# Patient Record
Sex: Female | Born: 1985 | Race: White | Hispanic: No | Marital: Married | Smoking: Never smoker
Health system: Southern US, Academic
[De-identification: ages and names within clinical notes are randomized; demographics above are authoritative.]

## PROBLEM LIST (undated history)

## (undated) DIAGNOSIS — I10 Essential (primary) hypertension: Secondary | ICD-10-CM

## (undated) HISTORY — PX: HX GALL BLADDER SURGERY/CHOLE: SHX55

---

## 2006-02-05 ENCOUNTER — Emergency Department: Payer: Self-pay | Admitting: Emergency Medicine

## 2009-09-24 ENCOUNTER — Emergency Department: Payer: Self-pay | Admitting: Emergency Medicine

## 2009-11-05 ENCOUNTER — Emergency Department: Payer: Self-pay | Admitting: Unknown Physician Specialty

## 2009-11-17 ENCOUNTER — Ambulatory Visit: Payer: Self-pay

## 2010-04-21 ENCOUNTER — Emergency Department: Payer: Self-pay | Admitting: Emergency Medicine

## 2013-02-04 ENCOUNTER — Emergency Department: Payer: Self-pay | Admitting: Emergency Medicine

## 2013-05-03 ENCOUNTER — Emergency Department: Payer: Self-pay | Admitting: Emergency Medicine

## 2014-08-29 ENCOUNTER — Emergency Department: Payer: Self-pay | Admitting: Emergency Medicine

## 2014-08-29 LAB — BASIC METABOLIC PANEL
Anion Gap: 8 (ref 7–16)
BUN: 5 mg/dL — AB (ref 7–18)
CO2: 29 mmol/L (ref 21–32)
CREATININE: 0.59 mg/dL — AB (ref 0.60–1.30)
Calcium, Total: 8 mg/dL — ABNORMAL LOW (ref 8.5–10.1)
Chloride: 106 mmol/L (ref 98–107)
EGFR (African American): >60
EGFR (Non-African Amer.): >60
GLUCOSE: 75 mg/dL (ref 65–99)
OSMOLALITY: 281 (ref 275–301)
Potassium: 3.5 mmol/L (ref 3.5–5.1)
Sodium: 143 mmol/L (ref 136–145)

## 2014-08-29 LAB — CBC
HCT: 38.9 % (ref 35.0–47.0)
HGB: 12.7 g/dL (ref 12.0–16.0)
MCH: 32.1 pg (ref 26.0–34.0)
MCHC: 32.7 g/dL (ref 32.0–36.0)
MCV: 98 fL (ref 80–100)
Platelet: 216 10*3/uL (ref 150–440)
RBC: 3.96 10*6/uL (ref 3.80–5.20)
RDW: 12.2 % (ref 11.5–14.5)
WBC: 5.3 10*3/uL (ref 3.6–11.0)

## 2014-08-29 LAB — TROPONIN I: Troponin-I: <0.02 ng/mL

## 2014-11-01 ENCOUNTER — Emergency Department: Payer: Self-pay | Admitting: Internal Medicine

## 2015-02-27 ENCOUNTER — Emergency Department
Admit: 2015-02-27 | Disposition: A | Discharge status: Home / Self Care | Payer: Self-pay | Admitting: Emergency Medicine

## 2015-02-27 LAB — URINALYSIS, COMPLETE
Bilirubin,UR: NEGATIVE
Blood: NEGATIVE
Glucose,UR: NEGATIVE mg/dL
Ketone: NEGATIVE
Leukocyte Esterase: NEGATIVE
Nitrite: NEGATIVE
Ph: 6
Protein: NEGATIVE
Specific Gravity: 1.002

## 2015-05-29 ENCOUNTER — Emergency Department
Admission: EM | Admit: 2015-05-29 | Discharge: 2015-05-29 | Disposition: A | Discharge status: Home / Self Care | Attending: Emergency Medicine | Admitting: Emergency Medicine

## 2015-05-29 ENCOUNTER — Encounter (HOSPITAL_COMMUNITY): Payer: Self-pay

## 2015-05-29 ENCOUNTER — Emergency Department (EMERGENCY_DEPARTMENT_HOSPITAL)

## 2015-05-29 DIAGNOSIS — W010XXA Fall on same level from slipping, tripping and stumbling without subsequent striking against object, initial encounter: Secondary | ICD-10-CM | POA: Insufficient documentation

## 2015-05-29 DIAGNOSIS — R609 Edema, unspecified: Secondary | ICD-10-CM

## 2015-05-29 DIAGNOSIS — S93401A Sprain of unspecified ligament of right ankle, initial encounter: Secondary | ICD-10-CM

## 2015-05-29 MED ORDER — IBUPROFEN 600 MG TABLET
600.00 mg | ORAL_TABLET | Freq: Three times a day (TID) | ORAL | Status: AC | PRN
Start: 2015-05-29 — End: ?

## 2015-05-29 MED ORDER — NAPROXEN 500 MG TABLET
500.00 mg | ORAL_TABLET | ORAL | Status: AC
Start: 2015-05-29 — End: 2015-05-29
  Administered 2015-05-29: 500 mg via ORAL
  Filled 2015-05-29: qty 1

## 2015-05-29 NOTE — ED Nurses Note (Signed)
Pt provided crutches and given instruction for proper use. Verbalized  Understanding. Resident at bedside. Awaiting final results of xrays.

## 2015-05-29 NOTE — ED Provider Notes (Signed)
[facility] Emergency Department    CC:  Ankle Injury    HPI:  Jeanette Trevino is a 29 y.o. female who presents with right ankle injury.  Patient states she tripped and had a mechanical fall early this morning during which she rolled her right ankle and fell to her knees.  She was initially ambulatory at the time.  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  She complaints of pain throughout the ankle.  She denies other injuries at this time.      ROS:  Constitutional: No fever, chills, or weakness  HENT: No headaches  Eyes: No vision changes   Cardio: No chest pain  Respiratory: No cough, SOB  GI:  No nausea, vomiting, diarrhea, constipation  MSK: + Right ankle pain  Neuro: No loss of sensation, confusion, focal deficits, numbness, tingling  Psychiatric: No mood changes  All other systems negative.    Medications:  Medications Prior to Admission     None          Allergies:  No Known Allergies  Allergies reviewed with patient    History reviewed. No pertinent past medical history.      History reviewed with patient    Past Surgical History   Procedure Laterality Date    Hx gall bladder surgery/chole             History     Social History    Marital Status: N/A     Spouse Name: N/A    Number of Children: N/A    Years of Education: N/A     Occupational History    Not on file.     Social History Main Topics    Smoking status: Never Smoker     Smokeless tobacco: Not on file    Alcohol Use: No    Drug Use: No    Sexual Activity: Not on file     Other Topics Concern    Not on file     Social History Narrative    No narrative on file       No family history on file.        Physical Exam:  All nurse's notes reviewed.  ED Triage Vitals   Enc Vitals Group      BP (Non-Invasive) 05/29/15 0642 146/88 mmHg      Heart Rate 05/29/15 0642 118      Respiratory Rate 05/29/15 0642 20      Temperature 05/29/15  0642 36.8 C (98.2 F)      Temp src --       SpO2-1 05/29/15 0642 97 %      Weight 05/29/15 0642 136.079 kg (300 lb)      Height 05/29/15 0642 1.702 m (5' 7.01")      Head Cir --       Peak Flow --       Pain Score --       Pain Loc --       Pain Edu? --       Excl. in GC? --      Constitutional: NAD. Oriented  HENT:   Head: Normocephalic and atraumatic.   Mouth/Throat: Oropharynx is clear and moist.   Eyes: EOMI, conjunctivae without discharge bilaterally  Neck: Trachea midline.   Cardiovascular: RRR, No murmurs, rubs or gallops.   Pulmonary/Chest: BS equal  bilaterally, good air movement. No respiratory distress. No wheezes, rales.   Abdominal: BS +. Abdomen soft, no tenderness, rebound or guarding.               Musculoskeletal: Right ankle TTP over lateral malleolus and fifth metatarsal.  Slight swelling to the area, no ecchymosis.  Decreased ROM.  To tenderness along proximal fibula. 2+ pedal pulse   Skin: warm and dry. No rash, erythema, pallor or cyanosis  Psychiatric: normal mood and affect. Behavior is normal.   Neurological: Alert&Ox3. Grossly intact.     Labs:  No results found for this or any previous visit (from the past 24 hour(s)).    Imaging:  Results for orders placed or performed during the hospital encounter of 05/29/15   XR ANKLE RIGHT     Status: None    Narrative    Jeanette Trevino  XR ANKLE RIGHT performed on May 29, 2015  7:05 AM.    CLINICAL HISTORY: 29 y.o. female with fall, pain.    3 views of the right ankle were obtained with no comparisons available.    FINDINGS:  There is no evidence of acute fracture or dislocation visualized osseous   structures.  There is soft tissue edema about the ankle, particularly over   the lateral aspect.  Alignment of the ankle joint is maintained the ankle   mortise preserved.      Impression    Soft tissue edema with no evidence of acute fracture or dislocation.     XR FOOT RIGHT     Status: None    Narrative    Jeanette Trevino  XR FOOT RIGHT performed on  May 29, 2015  7:05 AM.    CLINICAL HISTORY: 29 y.o. female with fall, pain.    3 views of the right foot were obtained with no comparisons available.    FINDINGS:  There is no evidence of acute fracture or dislocation in the visualized   osseous structures.  Soft tissue edema is seen about the ankle and the mid   foot.  Normal alignment of the foot is maintained.      Impression    Soft tissue edema of the ankle and foot with no fracture or dislocation   seen.     XR TIBIA-FIBULA RIGHT     Status: None    Narrative    Jeanette Trevino  XR TIBIA-FIBULA RIGHT performed on May 29, 2015  7:05 AM.    CLINICAL HISTORY: 29 y.o. female with fall, pain.    3 views of the right tibia and fibula were obtained with no comparisons   available.    FINDINGS:  There is no evidence of acute fracture or dislocation in the visualized   osseous structures.  Edema is seen about the ankle, otherwise no soft   tissue abnormalities are seen.  Alignment of the knee and ankle joints is   preserved.      Impression    Soft tissue edema about the ankle with no evidence of fracture or   dislocation.       The following orders have been placed.    Orders Placed This Encounter    XR ANKLE RIGHT    XR FOOT RIGHT    XR TIBIA-FIBULA RIGHT       Neita Goodnight, MD 05/29/2015, 06:48    Abnormal Lab results:  Labs Reviewed - No data to display    Plan: Appropriate labs and imaging ordered. Medical Records reviewed.  Therapy/Procedures/Course/MDM: Patient was vitally stable throughout visit.  She was given naproxen for pain.  Xrays of the right foot, ankle, and tib/fib were negative for acute bony injury.  Patient was updated with these results.  She was given an aircast for support of the ankle and crutches and told to follow up with her PCP.  They were given an opportunity to ask questions.  Consults: None  Impression: Jeanette Trevino is a 29 year old female presenting with right ankle injury.  Disposition:    Following the above history, physical  exam, and studies, the patient was deemed stable and suitable for discharge and she will follow up with her PCP. Prescriptions were written for Motrin.  Medication instructions were discussed with the patient/patient's family. Patient was advised to return to the ED with any new, concerning or worsening symptoms and follow up as directed. The patient verbalized understanding of all instructions and had no further questions or concerns.       Neita GoodnightKristen Rosario Kushner, MD 06/07/2015, 13:06  PGY II  [facility]

## 2015-05-29 NOTE — ED Attending Note (Signed)
Critical Care time billed for this visit is 0 min (exclusive of procedure time)    Note begun by: Staci RighterAndrew Zaylynn Rickett, MD on 05/29/2015 at 7:30 AM    History Limitation:  none    Attestation:  I was physically present and directly supervised this patient's care.  Patient seen and examined.  Resident / Trixie DredgeMidlevel / NP history and exam reviewed.   Key elements in addition to and/or correction of that documentation are as follows:    HPI :    29 y.o. y.o. female presents with chief complaint of right ankle injury.  Inversion mechanism.  Didn't fall.  Didn't take any meds.      PMHx:  none    PE :   VS on presentation: Blood pressure 146/88, pulse 118, temperature 36.8 C (98.2 F), resp. rate 20, height 1.702 m (5' 7.01"), weight 136.079 kg (300 lb), last menstrual period 05/22/2015, SpO2 97 %.  Per midlevel provider or resident note    Data/Test :    Pulse-Ox:  Reviewed; Oxygen therapy needed? no  EKG : none  Images Personally Reviewed : None  Image Reports Reviewed:    XR ANKLE RIGHT    (Results Pending)   XR FOOT RIGHT    (Results Pending)   XR TIBIA-FIBULA RIGHT    (Results Pending)     Labs:  Laboratory values obtained as documented in the patients chart and the resident note.  Notable lab values:   Labs Reviewed - No data to display    Review of Prior Data :       Prior Images : None  Prior EKG : None  Online Medical Records:  None  Transfer Docs/Images:  None    Initial Assessment:  Ankle Sprain    MDM/Plan:    XR, naproxen, air cast, crutches    ED Course:   none      PROCEDURES: none    Disposition: Discharge    Clinical Impression:     Encounter Diagnosis   Name Primary?    Right ankle sprain, initial encounter Yes       CRITICAL CARE : none (exclusive of procedure time)    This note was completed after the conclusion of care given the need for direct patient care at the time of service.

## 2015-05-29 NOTE — ED Nurses Note (Signed)
Patient to x-ray via wheel chair

## 2015-05-29 NOTE — Discharge Instructions (Signed)
Please use ice, heat, over the counter pain medications such as tylenol or ibuprofen if instructed not to avoid these, for your symptoms.    Please take all medications you were given prescriptions for as prescribed for your symptoms or diagnoses.    Please follow up with your primary care physician in 3-5 days or earlier if needed for your symptoms.      Please return to the Emergency department if you have persistence or worsening of your symptoms.      General Instructions:   You are considered stable for discharge from the emergency department.  Please carefully follow all the instructions you were given verbally as well as the written instructions given below.  In general, immediately return to the emergency department if the symptoms you presented with today increase in severity, change in any way, and/or do not improve in what you consider an acceptable time frame.  Return if you develop fever >101, vomiting, oral liquid intolerance, chest pain, shortness of breath, weakness, change in behavior, or any other concerns.    Medication(s) Instructions (if applicable):   If you were given any medication(s) upon discharge, please strictly follow the directions as prescribed for taking the medication(s).  Should you feel you develop any type of reaction to the medication(s), including, but not limited to, rash, swelling of the lips or face, or difficulty breathing, immediately discontinue the use of the medication(s) and seek prompt medical care.  Please read the medication(s) insert provided by the pharmacy and follow all guidelines and recommendations.       Follow-Up Instructions:   If you were given instructions to follow-up with a health care provider upon discharge, please be sure to do so.  If an appointment has been requested for you by the Emergency Department, someone from the doctor's office will call you to schedule an appointment.  If you do not hear from that office within an appropriate period of  time, based on when you should have a follow-up appointment, please contact those offices.  Again, if your primary care physician is not part of the [facility], you must contact that office to schedule the appointment.  Please take a copy of your discharge papers with you to your follow-up appointment(s).        Special Information / Instructions:     Please read and follow all attached discharge instructions.      Please call the [facility] at [phone] with any questions or concerns.    Thank you for the opportunity to be a part of your healthcare team.

## 2015-05-29 NOTE — ED Nurses Note (Signed)
Pt return from xray. Pt remains sitting in WC due to pain to stand and pivot. Pt given call bell. Awaiting xray results.

## 2015-06-01 ENCOUNTER — Encounter: Payer: Self-pay | Admitting: Emergency Medicine

## 2015-06-01 ENCOUNTER — Emergency Department
Admission: EM | Admit: 2015-06-01 | Discharge: 2015-06-01 | Disposition: A | Discharge status: Home / Self Care | Payer: BLUE CROSS/BLUE SHIELD | Attending: Emergency Medicine | Admitting: Emergency Medicine

## 2015-06-01 DIAGNOSIS — Y9289 Other specified places as the place of occurrence of the external cause: Secondary | ICD-10-CM | POA: Insufficient documentation

## 2015-06-01 DIAGNOSIS — T148XXA Other injury of unspecified body region, initial encounter: Secondary | ICD-10-CM

## 2015-06-01 DIAGNOSIS — Y9389 Activity, other specified: Secondary | ICD-10-CM | POA: Insufficient documentation

## 2015-06-01 DIAGNOSIS — S3992XA Unspecified injury of lower back, initial encounter: Secondary | ICD-10-CM | POA: Diagnosis present

## 2015-06-01 DIAGNOSIS — X58XXXA Exposure to other specified factors, initial encounter: Secondary | ICD-10-CM | POA: Insufficient documentation

## 2015-06-01 DIAGNOSIS — Z720 Tobacco use: Secondary | ICD-10-CM | POA: Diagnosis not present

## 2015-06-01 DIAGNOSIS — S39012A Strain of muscle, fascia and tendon of lower back, initial encounter: Secondary | ICD-10-CM | POA: Diagnosis not present

## 2015-06-01 DIAGNOSIS — Y998 Other external cause status: Secondary | ICD-10-CM | POA: Diagnosis not present

## 2015-06-01 HISTORY — DX: Essential (primary) hypertension: I10

## 2015-06-01 MED ORDER — ORPHENADRINE CITRATE 30 MG/ML IJ SOLN
60.0000 mg | Freq: Two times a day (BID) | INTRAMUSCULAR | Status: DC
  Administered 2015-06-01: 60 mg via INTRAMUSCULAR
  Filled 2015-06-01: qty 2

## 2015-06-01 MED ORDER — IBUPROFEN 600 MG PO TABS: 600.0000 mg | ORAL_TABLET | Freq: Three times a day (TID) | ORAL | Status: DC | PRN

## 2015-06-01 MED ORDER — KETOROLAC TROMETHAMINE 30 MG/ML IJ SOLN
30.0000 mg | Freq: Once | INTRAMUSCULAR | Status: AC
  Administered 2015-06-01: 30 mg via INTRAVENOUS
  Filled 2015-06-01: qty 1

## 2015-06-01 MED ORDER — CYCLOBENZAPRINE HCL 5 MG PO TABS: 5.0000 mg | ORAL_TABLET | Freq: Three times a day (TID) | ORAL | Status: DC | PRN

## 2015-06-01 NOTE — Discharge Instructions (Signed)
Take medication as prescribed. Alternate heat and ice for comfort. Stretch well. Use proper body mechanics when lifting.  Follow-up with your primary care physician next week. Return to the ER for new or worsening concerns.  Back Exercises Back exercises help treat and prevent back injuries. The goal of back exercises is to increase the strength of your abdominal and back muscles and the flexibility of your back. These exercises should be started when you no longer have back pain. Back exercises include:  Pelvic Tilt. Lie on your back with your knees bent. Tilt your pelvis until the lower part of your back is against the floor. Hold this position 5 to 10 sec and repeat 5 to 10 times.  Knee to Chest. Pull first 1 knee up against your chest and hold for 20 to 30 seconds, repeat this with the other knee, and then both knees. This may be done with the other leg straight or bent, whichever feels better.  Sit-Ups or Curl-Ups. Bend your knees 90 degrees. Start with tilting your pelvis, and do a partial, slow sit-up, lifting your trunk only 30 to 45 degrees off the floor. Take at least 2 to 3 seconds for each sit-up. Do not do sit-ups with your knees out straight. If partial sit-ups are difficult, simply do the above but with only tightening your abdominal muscles and holding it as directed.  Hip-Lift. Lie on your back with your knees flexed 90 degrees. Push down with your feet and shoulders as you raise your hips a couple inches off the floor; hold for 10 seconds, repeat 5 to 10 times.  Back arches. Lie on your stomach, propping yourself up on bent elbows. Slowly press on your hands, causing an arch in your low back. Repeat 3 to 5 times. Any initial stiffness and discomfort should lessen with repetition over time.  Shoulder-Lifts. Lie face down with arms beside your body. Keep hips and torso pressed to floor as you slowly lift your head and shoulders off the floor. Do not overdo your exercises, especially  in the beginning. Exercises may cause you some mild back discomfort which lasts for a few minutes; however, if the pain is more severe, or lasts for more than 15 minutes, do not continue exercises until you see your caregiver. Improvement with exercise therapy for back problems is slow.  See your caregivers for assistance with developing a proper back exercise program. Document Released: 12/14/2004 Document Revised: 01/29/2012 Document Reviewed: 09/07/2011 [facility] Patient Information 2015 [place] [employer], [employer]. This information is not intended to replace advice given to you by your health care provider. Make sure you discuss any questions you have with your health care provider.  Muscle Strain A muscle strain is an injury that occurs when a muscle is stretched beyond its normal length. Usually a small number of muscle fibers are torn when this happens. Muscle strain is rated in degrees. First-degree strains have the least amount of muscle fiber tearing and pain. Second-degree and third-degree strains have increasingly more tearing and pain.  Usually, recovery from muscle strain takes 1-2 weeks. Complete healing takes 5-6 weeks.  CAUSES  Muscle strain happens when a sudden, violent force placed on a muscle stretches it too far. This may occur with lifting, sports, or a fall.  RISK FACTORS Muscle strain is especially common in athletes.  SIGNS AND SYMPTOMS At the site of the muscle strain, there may be:  Pain.  Bruising.  Swelling.  Difficulty using the muscle due to pain or lack of  normal function. DIAGNOSIS  Your health care provider will perform a physical exam and ask about your medical history. TREATMENT  Often, the best treatment for a muscle strain is resting, icing, and applying cold compresses to the injured area.  HOME CARE INSTRUCTIONS   Use the PRICE method of treatment to promote muscle healing during the first 2-3 days after your injury. The PRICE method involves:  Protecting  the muscle from being injured again.  Restricting your activity and resting the injured body part.  Icing your injury. To do this, put ice in a plastic bag. Place a towel between your skin and the bag. Then, apply the ice and leave it on from 15-20 minutes each hour. After the third day, switch to moist heat packs.  Apply compression to the injured area with a splint or elastic bandage. Be careful not to wrap it too tightly. This may interfere with blood circulation or increase swelling.  Elevate the injured body part above the level of your heart as often as you can.  Only take over-the-counter or prescription medicines for pain, discomfort, or fever as directed by your health care provider.  Warming up prior to exercise helps to prevent future muscle strains. SEEK MEDICAL CARE IF:   You have increasing pain or swelling in the injured area.  You have numbness, tingling, or a significant loss of strength in the injured area. MAKE SURE YOU:   Understand these instructions.  Will watch your condition.  Will get help right away if you are not doing well or get worse. Document Released: 11/06/2005 Document Revised: 08/27/2013 Document Reviewed: 06/05/2013 [facility] Patient Information 2015 [place], [place]. This information is not intended to replace advice given to you by your health care provider. Make sure you discuss any questions you have with your health care provider.
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
## 2015-09-25 ENCOUNTER — Encounter: Payer: Self-pay | Admitting: Emergency Medicine

## 2015-09-25 ENCOUNTER — Emergency Department: Payer: BLUE CROSS/BLUE SHIELD

## 2015-09-25 ENCOUNTER — Emergency Department
Admission: EM | Admit: 2015-09-25 | Discharge: 2015-09-25 | Disposition: A | Discharge status: Home / Self Care | Payer: BLUE CROSS/BLUE SHIELD | Attending: Emergency Medicine | Admitting: Emergency Medicine

## 2015-09-25 DIAGNOSIS — I10 Essential (primary) hypertension: Secondary | ICD-10-CM | POA: Insufficient documentation

## 2015-09-25 DIAGNOSIS — R531 Weakness: Secondary | ICD-10-CM | POA: Diagnosis not present

## 2015-09-25 DIAGNOSIS — Z720 Tobacco use: Secondary | ICD-10-CM | POA: Diagnosis not present

## 2015-09-25 DIAGNOSIS — R51 Headache: Secondary | ICD-10-CM | POA: Diagnosis not present

## 2015-09-25 DIAGNOSIS — R569 Unspecified convulsions: Secondary | ICD-10-CM | POA: Insufficient documentation

## 2015-09-25 DIAGNOSIS — Z3202 Encounter for pregnancy test, result negative: Secondary | ICD-10-CM | POA: Insufficient documentation

## 2015-09-25 DIAGNOSIS — R519 Headache, unspecified: Secondary | ICD-10-CM

## 2015-09-25 DIAGNOSIS — Z79899 Other long term (current) drug therapy: Secondary | ICD-10-CM | POA: Diagnosis not present

## 2015-09-25 LAB — CBC
HEMATOCRIT: 39.5 % (ref 35.0–47.0)
HEMOGLOBIN: 13.4 g/dL (ref 12.0–16.0)
MCH: 32.8 pg (ref 26.0–34.0)
MCHC: 34 g/dL (ref 32.0–36.0)
MCV: 96.4 fL (ref 80.0–100.0)
Platelets: 234 10*3/uL (ref 150–440)
RBC: 4.09 MIL/uL (ref 3.80–5.20)
RDW: 12.2 % (ref 11.5–14.5)
WBC: 10.1 10*3/uL (ref 3.6–11.0)

## 2015-09-25 LAB — URINALYSIS COMPLETE WITH MICROSCOPIC (ARMC ONLY)
Bilirubin Urine: NEGATIVE
Glucose, UA: NEGATIVE mg/dL
Ketones, ur: NEGATIVE mg/dL
LEUKOCYTES UA: NEGATIVE
NITRITE: NEGATIVE
PH: 5 (ref 5.0–8.0)
PROTEIN: NEGATIVE mg/dL
Specific Gravity, Urine: 1.011 (ref 1.005–1.030)

## 2015-09-25 LAB — BASIC METABOLIC PANEL
ANION GAP: 4 — AB (ref 5–15)
BUN: 9 mg/dL (ref 6–20)
CO2: 25 mmol/L (ref 22–32)
Calcium: 8.8 mg/dL — ABNORMAL LOW (ref 8.9–10.3)
Chloride: 108 mmol/L (ref 101–111)
Creatinine, Ser: 0.81 mg/dL (ref 0.44–1.00)
GLUCOSE: 120 mg/dL — AB (ref 65–99)
POTASSIUM: 3.2 mmol/L — AB (ref 3.5–5.1)
SODIUM: 137 mmol/L (ref 135–145)

## 2015-09-25 LAB — URINE DRUG SCREEN, QUALITATIVE (ARMC ONLY)
AMPHETAMINES, UR SCREEN: NOT DETECTED
Barbiturates, Ur Screen: NOT DETECTED
Benzodiazepine, Ur Scrn: NOT DETECTED
COCAINE METABOLITE, UR ~~LOC~~: NOT DETECTED
Cannabinoid 50 Ng, Ur ~~LOC~~: NOT DETECTED
MDMA (ECSTASY) UR SCREEN: NOT DETECTED
METHADONE SCREEN, URINE: NOT DETECTED
OPIATE, UR SCREEN: NOT DETECTED
Phencyclidine (PCP) Ur S: NOT DETECTED
Tricyclic, Ur Screen: NOT DETECTED

## 2015-09-25 LAB — POCT PREGNANCY, URINE: Preg Test, Ur: NEGATIVE

## 2015-09-25 MED ORDER — BUTALBITAL-APAP-CAFFEINE 50-325-40 MG PO TABS
2.0000 | ORAL_TABLET | Freq: Once | ORAL | Status: AC
Start: 2015-09-25 — End: 2015-09-25
  Administered 2015-09-25: 2 via ORAL
  Filled 2015-09-25: qty 2

## 2015-09-25 NOTE — ED Notes (Signed)
Pt reports she thinks she had a seizure on Thursday. States she woke up feeling tired and the right side of her face was swollen. Pt reports she felt tired all day and has had a headache for several days. Pt reports she has a hx of seizures but has never been on medication for them. Pt does report she was on depression medication for about 3 years until about 6 months ago.

## 2015-09-25 NOTE — ED Notes (Signed)
Pt reports seizure possibly on Thursday night.  Pt reports she woke up feeling "off" with pain and swelling to right side of face.  Pt unaware of any possible injury.  Pt reports in past she had seizures, but infrequent, starting in her 7020s and last one 3 years ago.  PT denies being on any seizure medications.  Pt NAD at this time.

## 2015-09-25 NOTE — Discharge Instructions (Signed)
Weakness Weakness is a lack of strength. It may be felt all over the body (generalized) or in one specific part of the body (focal). Some causes of weakness can be serious. You may need further medical evaluation, especially if you are elderly or you have a history of immunosuppression (such as chemotherapy or HIV), kidney disease, heart disease, or diabetes. CAUSES  Weakness can be caused by many different things, including:  Infection.  Physical exhaustion.  Internal bleeding or other blood loss that results in a lack of red blood cells (anemia).  Dehydration. This cause is more common in elderly people.  Side effects or electrolyte abnormalities from medicines, such as pain medicines or sedatives.  Emotional distress, anxiety, or depression.  Circulation problems, especially severe peripheral arterial disease.  Heart disease, such as rapid atrial fibrillation, bradycardia, or heart failure.  Nervous system disorders, such as Guillain-Barr syndrome, multiple sclerosis, or stroke. DIAGNOSIS  To find the cause of your weakness, your caregiver will take your history and perform a physical exam. Lab tests or X-rays may also be ordered, if needed. TREATMENT  Treatment of weakness depends on the cause of your symptoms and can vary greatly. HOME CARE INSTRUCTIONS   Rest as needed.  Eat a well-balanced diet.  Try to get some exercise every day.  Only take over-the-counter or prescription medicines as directed by your caregiver. SEEK MEDICAL CARE IF:   Your weakness seems to be getting worse or spreads to other parts of your body.  You develop new aches or pains. SEEK IMMEDIATE MEDICAL CARE IF:   You cannot perform your normal daily activities, such as getting dressed and feeding yourself.  You cannot walk up and down stairs, or you feel exhausted when you do so.  You have shortness of breath or chest pain.  You have difficulty moving parts of your body.  You have weakness  in only one area of the body or on only one side of the body.  You have a fever.  You have trouble speaking or swallowing.  You cannot control your bladder or bowel movements.  You have black or bloody vomit or stools. MAKE SURE YOU:  Understand these instructions.  Will watch your condition.  Will get help right away if you are not doing well or get worse.   This information is not intended to replace advice given to you by your health care provider. Make sure you discuss any questions you have with your health care provider.   Document Released: 11/06/2005 Document Revised: 05/07/2012 Document Reviewed: 01/05/2012 Elsevier Interactive Patient Education 2016 [employer]  General Headache Without Cause A headache is pain or discomfort felt around the head or neck area. There are many causes and types of headaches. In some cases, the cause may not be found.  HOME CARE  Managing Pain  Take over-the-counter and prescription medicines only as told by your doctor.  Lie down in a dark, quiet room when you have a headache.  If directed, apply ice to the head and neck area:  Put ice in a plastic bag.  Place a towel between your skin and the bag.  Leave the ice on for 20 minutes, 2-3 times per day.  Use a heating pad or hot shower to apply heat to the head and neck area as told by your doctor.  Keep lights dim if bright lights bother you or make your headaches worse. Eating and Drinking  Eat meals on a regular schedule.  Lessen how much  alcohol you drink.  Lessen how much caffeine you drink, or stop drinking caffeine. General Instructions  Keep all follow-up visits as told by your doctor. This is important.  Keep a journal to find out if certain things bring on headaches. For example, write down:  What you eat and drink.  How much sleep you get.  Any change to your diet or medicines.  Relax by getting a massage or doing other relaxing activities.  Lessen  stress.  Sit up straight. Do not tighten (tense) your muscles.  Do not use tobacco products. This includes cigarettes, chewing tobacco, or e-cigarettes. If you need help quitting, ask your doctor.  Exercise regularly as told by your doctor.  Get enough sleep. This often means 7-9 hours of sleep. GET HELP IF:  Your symptoms are not helped by medicine.  You have a headache that feels different than the other headaches.  You feel sick to your stomach (nauseous) or you throw up (vomit).  You have a fever. GET HELP RIGHT AWAY IF:   Your headache becomes really bad.  You keep throwing up.  You have a stiff neck.  You have trouble seeing.  You have trouble speaking.  You have pain in the eye or ear.  Your muscles are weak or you lose muscle control.  You lose your balance or have trouble walking.  You feel like you will pass out (faint) or you pass out.  You have confusion.   This information is not intended to replace advice given to you by your health care provider. Make sure you discuss any questions you have with your health care provider.   Document Released: 08/15/2008 Document Revised: 07/28/2015 Document Reviewed: 03/01/2015 Elsevier Interactive Patient Education [employer]  Seizure, Adult A seizure is abnormal electrical activity in the brain. Seizures usually last from 30 seconds to 2 minutes. There are various types of seizures. Before a seizure, you may have a warning sensation (aura) that a seizure is about to occur. An aura may include the following symptoms:   Fear or anxiety.  Nausea.  Feeling like the room is spinning (vertigo).  Vision changes, such as seeing flashing lights or spots. Common symptoms during a seizure include:  A change in attention or behavior (altered mental status).  Convulsions with rhythmic jerking movements.  Drooling.  Rapid eye movements.  Grunting.  Loss of bladder and bowel control.  Bitter taste in  the mouth.  Tongue biting. After a seizure, you may feel confused and sleepy. You may also have an injury resulting from convulsions during the seizure. HOME CARE INSTRUCTIONS   If you are given medicines, take them exactly as prescribed by your health care provider.  Keep all follow-up appointments as directed by your health care provider.  Do not swim or drive or engage in risky activity during which a seizure could cause further injury to you or others until your health care provider says it is OK.  Get adequate rest.  Teach friends and family what to do if you have a seizure. They should:  Lay you on the ground to prevent a fall.  Put a cushion under your head.  Loosen any tight clothing around your neck.  Turn you on your side. If vomiting occurs, this helps keep your airway clear.  Stay with you until you recover.  Know whether or not you need emergency care. SEEK IMMEDIATE MEDICAL CARE IF:  The seizure lasts longer than 5 minutes.  The seizure is severe  or you do not wake up immediately after the seizure.  You have an altered mental status after the seizure.  You are having more frequent or worsening seizures. Someone should drive you to the emergency department or call local emergency services (911 in [place] MAKE SURE YOU:  Understand these instructions.  Will watch your condition.  Will get help right away if you are not doing well or get worse.   This information is not intended to replace advice given to you by your health care provider. Make sure you discuss any questions you have with your health care provider.   Document Released: 11/03/2000 Document Revised: 11/27/2014 Document Reviewed: 06/18/2013 Elsevier Interactive Patient Education [employer]

## 2015-09-25 NOTE — ED Provider Notes (Signed)
[facility] Emergency Department Provider Note  ____________________________________________  Time seen: Approximately 4:46 AM  I have reviewed the triage vital signs and the nursing notes.   HISTORY  Chief Complaint Seizures    HPI Renee Barrera is a 29 y.o. female who comes into the hospital thinking that she had a seizure. The patient reports that she thinks she had a seizure tonight's ago. She reports that when she woke up yesterday morning she felt very weak and had a headache. The patient also reports that her right face was swollen. The patient has had seizures before but the last was approximately 3 years ago. She reports that she's been to the hospital in the past and the doctor once but no one has given her any medication for her seizures. The patient reports that her seizure was unwitnessed. The patient reports that she isn't sleeping well and has no other complaints. She is to be taking depression medication but reports that she has not been taking it for 6 months. The patient rates her headache as an 8 out of 10 in intensity. She denies any blurred vision any numbness or tingling anywhere. The patient reports that she did not want to come but her family encouraged her to come in and get checked out. The patient has not had a seizure since the presumed event overnight more than 24 hours ago.   Past Medical History  Diagnosis Date  . Hypertension     There are no active problems to display for this patient.   History reviewed. No pertinent past surgical history.  Current Outpatient Rx  Name  Route  Sig  Dispense  Refill  . calcium-vitamin D (OSCAL WITH D) 500-200 MG-UNIT tablet   Oral   Take 1 tablet by mouth.         . cyclobenzaprine (FLEXERIL) 5 MG tablet   Oral   Take 1 tablet (5 mg total) by mouth every 8 (eight) hours as needed for muscle spasms (or pain. Do not drive or operate machinery while taking as can cause drowsiness.).   12  tablet   0   . ibuprofen (ADVIL,MOTRIN) 600 MG tablet   Oral   Take 1 tablet (600 mg total) by mouth every 8 (eight) hours as needed for mild pain or moderate pain.   15 tablet   0     Allergies Review of patient's allergies indicates no known allergies.  History reviewed. No pertinent family history.  Social History Social History  Substance Use Topics  . Smoking status: Current Every Day Smoker -- 1.00 packs/day    Types: Cigarettes  . Smokeless tobacco: None  . Alcohol Use: No    Review of Systems Constitutional: No fever/chills Eyes: No visual changes. ENT: No sore throat. Cardiovascular: Denies chest pain. Respiratory: Denies shortness of breath. Gastrointestinal: No abdominal pain.  No nausea, no vomiting.  No diarrhea.  No constipation. Genitourinary: Negative for dysuria. Musculoskeletal: Negative for back pain. Skin: Negative for rash. Neurological: Generalized weakness and headache  10-point ROS otherwise negative.  ____________________________________________   PHYSICAL EXAM:  VITAL SIGNS: ED Triage Vitals  Enc Vitals Group     BP 09/25/15 0106 158/108 mmHg     Pulse Rate 09/25/15 0106 83     Resp 09/25/15 0106 18     Temp 09/25/15 0106 98.4 F (36.9 C)     Temp Source 09/25/15 0106 Oral     SpO2 09/25/15 0106 98 %     Weight  09/25/15 0106 100 lb (45.36 kg)     Height 09/25/15 0106 5\' 2"  (1.575 m)     Head Cir --      Peak Flow --      Pain Score 09/25/15 0107 8     Pain Loc --      Pain Edu? --      Excl. in GC? --     Constitutional: Alert and oriented. Well appearing and in mild distress. Eyes: Conjunctivae are normal. PERRL. EOMI. Head: Atraumatic. Nose: No congestion/rhinnorhea. Mouth/Throat: Mucous membranes are moist.  Oropharynx non-erythematous. Cardiovascular: Normal rate, regular rhythm. Grossly normal heart sounds.  Good peripheral circulation. Respiratory: Normal respiratory effort.  No retractions. Lungs  CTAB. Gastrointestinal: Soft and nontender. No distention.  Musculoskeletal: No lower extremity tenderness nor edema.  Neurologic:  Normal speech and language. No gross focal neurologic deficits are appreciated. No gait instability. Renal nerves II through XII are grossly intact with no motor or sensory deficits. Skin:  Skin is warm, dry and intact. Marland Kitchen. Psychiatric: Mood and affect are normal.   ____________________________________________   LABS (all labs ordered are listed, but only abnormal results are displayed)  Labs Reviewed  BASIC METABOLIC PANEL - Abnormal; Notable for the following:    Potassium 3.2 (*)    Glucose, Bld 120 (*)    Calcium 8.8 (*)    Anion gap 4 (*)    All other components within normal limits  URINALYSIS COMPLETEWITH MICROSCOPIC ([facility] ONLY) - Abnormal; Notable for the following:    Color, Urine YELLOW (*)    APPearance CLEAR (*)    Hgb urine dipstick 1+ (*)    Bacteria, UA RARE (*)    Squamous Epithelial / LPF 0-5 (*)    All other components within normal limits  CBC  URINE DRUG SCREEN, QUALITATIVE ([facility] ONLY)  POC URINE PREG, ED  POCT PREGNANCY, URINE   ____________________________________________  EKG  None ____________________________________________  RADIOLOGY  CT head: Normal, unenhanced CT of the brain ____________________________________________   PROCEDURES  Procedure(s) performed: None  Critical Care performed: No  ____________________________________________   INITIAL IMPRESSION / ASSESSMENT AND PLAN / ED COURSE  Pertinent labs & imaging results that were available during my care of the patient were reviewed by me and considered in my medical decision making (see chart for details).  This is a 29 year old female who comes in today with a presumed seizure at home. The patient has been seizure free for over 24 hours. The patient's blood work is unremarkable and her CT of her head is unremarkable. At this time we do not know  that the patient had an actual seizure she just was feeling weak when she woke up this morning. I did give the patient a dose of Fioricet for her headache which did help the pain. I will discharge the patient to have her follow-up with neurology for further evaluation of her seizure activity. I will not give the patient any medication as again she has been seizure free for 24 hours. The patient should follow-up for further clarification of these episodes which she is having ____________________________________________   FINAL CLINICAL IMPRESSION(S) / ED DIAGNOSES  Final diagnoses:  Generalized weakness  Acute nonintractable headache, unspecified headache type  Seizure (HCC)      Rebecka ApleyAllison P Twala Collings, MD 09/25/15 [phone]

## 2016-12-22 ENCOUNTER — Encounter: Payer: Self-pay | Admitting: Medical Oncology

## 2016-12-22 ENCOUNTER — Emergency Department: Payer: Self-pay

## 2016-12-22 ENCOUNTER — Emergency Department
Admission: EM | Admit: 2016-12-22 | Discharge: 2016-12-22 | Disposition: A | Discharge status: Home / Self Care | Payer: Self-pay | Attending: Emergency Medicine | Admitting: Emergency Medicine

## 2016-12-22 DIAGNOSIS — Z79899 Other long term (current) drug therapy: Secondary | ICD-10-CM | POA: Insufficient documentation

## 2016-12-22 DIAGNOSIS — F17210 Nicotine dependence, cigarettes, uncomplicated: Secondary | ICD-10-CM | POA: Insufficient documentation

## 2016-12-22 DIAGNOSIS — I10 Essential (primary) hypertension: Secondary | ICD-10-CM | POA: Insufficient documentation

## 2016-12-22 DIAGNOSIS — M25561 Pain in right knee: Secondary | ICD-10-CM | POA: Insufficient documentation

## 2016-12-22 DIAGNOSIS — Z791 Long term (current) use of non-steroidal anti-inflammatories (NSAID): Secondary | ICD-10-CM | POA: Insufficient documentation

## 2016-12-22 MED ORDER — TRAMADOL HCL 50 MG PO TABS
ORAL_TABLET | ORAL | Status: AC
  Administered 2016-12-22: 50 mg via ORAL
  Filled 2016-12-22: qty 1

## 2016-12-22 MED ORDER — TRAMADOL HCL 50 MG PO TABS
50.0000 mg | ORAL_TABLET | Freq: Once | ORAL | Status: AC
  Administered 2016-12-22: 50 mg via ORAL

## 2016-12-22 MED ORDER — MELOXICAM 15 MG PO TABS: 15.0000 mg | ORAL_TABLET | Freq: Every day | ORAL | 0 refills | Status: DC

## 2016-12-22 NOTE — ED Triage Notes (Signed)
Pt reports old injury to rt knee, began having pain to knee yesterday that worsened.

## 2016-12-22 NOTE — ED Provider Notes (Signed)
[facility] Emergency Department Provider Note  ____________________________________________  Time seen: Approximately 4:55 PM  I have reviewed the triage vital signs and the nursing notes.   HISTORY  Chief Complaint Knee Pain    HPI Renee Barrera is a 31 y.o. female who presents to emergency department complaining of right knee pain. Patient reports having a "recent" injury to her right knee while playing basketball. Upon further questioning, patient admits that this injury occurred 3 years ago. Patient denies any new or recent trauma. She reports generalized right knee pain. She denies any swelling. She denies any numbness or tingling distally.She has not tried any medications for this complaint prior to arrival.   Past Medical History:  Diagnosis Date  . Hypertension     There are no active problems to display for this patient.   History reviewed. No pertinent surgical history.  Prior to Admission medications   Medication Sig Start Date End Date Taking? Authorizing Provider  calcium-vitamin D (OSCAL WITH D) 500-200 MG-UNIT tablet Take 1 tablet by mouth.    Historical Provider, MD  cyclobenzaprine (FLEXERIL) 5 MG tablet Take 1 tablet (5 mg total) by mouth every 8 (eight) hours as needed for muscle spasms (or pain. Do not drive or operate machinery while taking as can cause drowsiness.). 06/01/15   Renford DillsLindsey Miller, NP  ibuprofen (ADVIL,MOTRIN) 600 MG tablet Take 1 tablet (600 mg total) by mouth every 8 (eight) hours as needed for mild pain or moderate pain. 06/01/15   Renford DillsLindsey Miller, NP  meloxicam (MOBIC) 15 MG tablet Take 1 tablet (15 mg total) by mouth daily. 12/22/16   Delorise RoyalsJonathan D Kaleyah Labreck, PA-C    Allergies Patient has no known allergies.  No family history on file.  Social History Social History  Substance Use Topics  . Smoking status: Current Every Day Smoker    Packs/day: 1.00    Types: Cigarettes  . Smokeless tobacco: Not on file  .  Alcohol use No     Review of Systems  Constitutional: No fever/chills Cardiovascular: no chest pain. Respiratory: no cough. No SOB. Musculoskeletal: Positive for right knee pain Skin: Negative for rash, abrasions, lacerations, ecchymosis. Neurological: Negative for headaches, focal weakness or numbness. 10-point ROS otherwise negative.  ____________________________________________   PHYSICAL EXAM:  VITAL SIGNS: ED Triage Vitals  Enc Vitals Group     BP 12/22/16 1545 (!) 149/99     Pulse Rate 12/22/16 1545 94     Resp 12/22/16 1545 18     Temp 12/22/16 1545 98.2 F (36.8 C)     Temp Source 12/22/16 1545 Oral     SpO2 12/22/16 1545 99 %     Weight 12/22/16 1545 100 lb (45.4 kg)     Height 12/22/16 1545 5\' 2"  (1.575 m)     Head Circumference --      Peak Flow --      Pain Score 12/22/16 1546 10     Pain Loc --      Pain Edu? --      Excl. in GC? --      Constitutional: Alert and oriented. Well appearing and in no acute distress. Eyes: Conjunctivae are normal. PERRL. EOMI. Head: Atraumatic. Neck: No stridor.    Cardiovascular: Normal rate, regular rhythm. Normal S1 and S2.  Good peripheral circulation. Respiratory: Normal respiratory effort without tachypnea or retractions. Lungs CTAB. Good air entry to the bases with no decreased or absent breath sounds. Musculoskeletal: Full range of motion to all extremities.  No gross deformities appreciated.No deformities or edema noted to the right knee pens inspection. Full range of motion. Patient is tender to palpation over the patella. No palpable abnormality. Varus, valgus, Lachman's, McMurray's is negative. Dorsalis pedis pulse intact distally. Sensation intact distally. Neurologic:  Normal speech and language. No gross focal neurologic deficits are appreciated.  Skin:  Skin is warm, dry and intact. No rash noted. Psychiatric: Mood and affect are normal. Speech and behavior are normal. Patient exhibits appropriate insight and  judgement.   ____________________________________________   LABS (all labs ordered are listed, but only abnormal results are displayed)  Labs Reviewed - No data to display ____________________________________________  EKG   ____________________________________________  RADIOLOGY Festus Barren Lucianne Smestad, personally viewed and evaluated these images (plain radiographs) as part of my medical decision making, as well as reviewing the written report by the radiologist.  Dg Knee Complete 4 Views Right  Result Date: 12/22/2016 CLINICAL DATA:  Chronic twisting injury to the right knee 2 years ago, with right lateral knee pain. Initial encounter. EXAM: RIGHT KNEE - COMPLETE 4+ VIEW COMPARISON:  None. FINDINGS: There is no evidence of fracture or dislocation. The joint spaces are preserved. No significant degenerative change is seen; the patellofemoral joint is grossly unremarkable in appearance. No significant joint effusion is seen. The visualized soft tissues are normal in appearance. IMPRESSION: No evidence of fracture or dislocation. Electronically Signed   By: Roanna Raider M.D.   On: 12/22/2016 16:41    ____________________________________________    PROCEDURES  Procedure(s) performed:    Procedures    Medications  traMADol (ULTRAM) tablet 50 mg (50 mg Oral Given 12/22/16 1746)     ____________________________________________   INITIAL IMPRESSION / ASSESSMENT AND PLAN / ED COURSE  Pertinent labs & imaging results that were available during my care of the patient were reviewed by me and considered in my medical decision making (see chart for details).  Review of the [place] CSRS was performed in accordance of the [facility] prior to dispensing any controlled drugs.     Patient's diagnosis is consistent with right knee pain. No recent injury. X-ray reveals no acute osseous abnormality.. Return for no indication of acute ligamentous injury.. Patient will be discharged home with  prescriptions for anti-inflammatories for symptom control. Patient is to follow up with orthopedics as needed or otherwise directed. Patient is given ED precautions to return to the ED for any worsening or new symptoms.     ____________________________________________  FINAL CLINICAL IMPRESSION(S) / ED DIAGNOSES  Final diagnoses:  Acute pain of right knee      NEW MEDICATIONS STARTED DURING THIS VISIT:  Discharge Medication List as of 12/22/2016  4:57 PM    START taking these medications   Details  meloxicam (MOBIC) 15 MG tablet Take 1 tablet (15 mg total) by mouth daily., Starting Fri 12/22/2016, Print            This chart was dictated using voice recognition software/Dragon. Despite best efforts to proofread, errors can occur which can change the meaning. Any change was purely unintentional.    Racheal Patches, PA-C 12/22/16 2210    Rockne Menghini, MD 12/27/16 2223

## 2017-01-03 ENCOUNTER — Encounter: Payer: Self-pay | Admitting: Medical Oncology

## 2017-01-03 ENCOUNTER — Emergency Department
Admission: EM | Admit: 2017-01-03 | Discharge: 2017-01-03 | Disposition: A | Discharge status: Home / Self Care | Payer: BLUE CROSS/BLUE SHIELD | Attending: Emergency Medicine | Admitting: Emergency Medicine

## 2017-01-03 DIAGNOSIS — F17210 Nicotine dependence, cigarettes, uncomplicated: Secondary | ICD-10-CM | POA: Insufficient documentation

## 2017-01-03 DIAGNOSIS — J111 Influenza due to unidentified influenza virus with other respiratory manifestations: Secondary | ICD-10-CM

## 2017-01-03 DIAGNOSIS — I10 Essential (primary) hypertension: Secondary | ICD-10-CM | POA: Insufficient documentation

## 2017-01-03 MED ORDER — OSELTAMIVIR PHOSPHATE 75 MG PO CAPS: 75.0000 mg | ORAL_CAPSULE | Freq: Two times a day (BID) | ORAL | 0 refills | Status: DC

## 2017-01-03 MED ORDER — PROMETHAZINE-DM 6.25-15 MG/5ML PO SYRP: 5.0000 mL | ORAL_SOLUTION | Freq: Four times a day (QID) | ORAL | 0 refills | Status: DC | PRN

## 2017-01-03 MED ORDER — FLUTICASONE PROPIONATE 50 MCG/ACT NA SUSP: 2.0000 | Freq: Every day | NASAL | 0 refills | Status: AC

## 2017-01-03 NOTE — ED Notes (Signed)
See triage note.states developed body aches fever and sore throat about 2 days ago afebrile on arrival

## 2017-01-03 NOTE — ED Provider Notes (Signed)
[facility] Emergency Department Provider Note  ____________________________________________  Time seen: Approximately 12:10 PM  I have reviewed the triage vital signs and the nursing notes.   HISTORY  Chief Complaint Fever; Chills; Generalized Body Aches; and Sore Throat    HPI Renee Barrera is a 31 y.o. female , NAD, presents to the emergency, 2 day history of flulike symptoms. Patient states she had sudden onset of fever, chills, body aches, sore throat, nasal congestion and cough 2 days ago. States symptoms have persisted despite taking over-the-counter medications. Last dose of cold medication was earlier this morning. States she has been exposed to multiple people at work who have had influenza. Has not received flu vaccine. Denies any chest pain, shortness breath, wheezing, abdominal pain, nausea, vomiting or diarrhea.   Past Medical History:  Diagnosis Date  . Hypertension     There are no active problems to display for this patient.   History reviewed. No pertinent surgical history.  Prior to Admission medications   Medication Sig Start Date End Date Taking? Authorizing Provider  calcium-vitamin D (OSCAL WITH D) 500-200 MG-UNIT tablet Take 1 tablet by mouth.    Historical Provider, MD  fluticasone (FLONASE) 50 MCG/ACT nasal spray Place 2 sprays into both nostrils daily. 01/03/17   Kynadee Dam L Meryn Sarracino, PA-C  oseltamivir (TAMIFLU) 75 MG capsule Take 1 capsule (75 mg total) by mouth 2 (two) times daily. 01/03/17   Sira Adsit L Leobardo Granlund, PA-C  promethazine-dextromethorphan (PROMETHAZINE-DM) 6.25-15 MG/5ML syrup Take 5 mLs by mouth 4 (four) times daily as needed for cough. 01/03/17   Bacilio Abascal L Lue Sykora, PA-C    Allergies Patient has no known allergies.  No family history on file.  Social History Social History  Substance Use Topics  . Smoking status: Current Every Day Smoker    Packs/day: 1.00    Types: Cigarettes  . Smokeless tobacco: Not on file  . Alcohol  use No     Review of Systems  Constitutional: Positive fever, chills, fatigue. ENT: Positive sore throat, nasal congestion, runny nose. Cardiovascular: No chest pain. Respiratory: Positive cough, chest congestion. No shortness of breath. No wheezing.  Gastrointestinal: No abdominal pain.  No nausea, vomiting.  No diarrhea.   Musculoskeletal: Also for general myalgias.  Skin: Negative for rash. Neurological: Negative for headaches. 10-point ROS otherwise negative.  ____________________________________________   PHYSICAL EXAM:  VITAL SIGNS: ED Triage Vitals  Enc Vitals Group     BP 01/03/17 1124 131/81     Pulse Rate 01/03/17 1124 (!) 107     Resp 01/03/17 1124 16     Temp 01/03/17 1124 98.8 F (37.1 C)     Temp Source 01/03/17 1124 Oral     SpO2 01/03/17 1124 100 %     Weight 01/03/17 1121 100 lb (45.4 kg)     Height 01/03/17 1121 5\' 2"  (1.575 m)     Head Circumference --      Peak Flow --      Pain Score 01/03/17 1122 9     Pain Loc --      Pain Edu? --      Excl. in GC? --      Constitutional: Alert and oriented. Ill appearing but in no acute distress. Eyes: Conjunctivae are normal Without icterus, injection or discharge. Head: Atraumatic. ENT:      Ears: TMs visualized bilaterally with moderate effusion but no bulging, erythema or perforation.      Nose: No congestion with moderate clear rhinorrhea. Bilateral  turbinates are injected with mild edema.      Mouth/Throat: Mucous membranes are moist. Pharynx without erythema, swelling, exudate. Uvula is midline. Airway is patent. Clear postnasal drainage. Neck: Supple with full range of motion. Hematological/Lymphatic/Immunilogical: No cervical lymphadenopathy. Cardiovascular: Normal rate, regular rhythm. Normal S1 and S2.  No murmurs, rubs, gallops. Good peripheral circulation. Respiratory: Normal respiratory effort without tachypnea or retractions. Lungs CTAB with breath sounds noted in all lung fields. No wheeze,  rhonchi, rales Neurologic:  Normal speech and language. No gross focal neurologic deficits are appreciated.  Skin:  Skin is warm, dry and intact. No rash noted. Psychiatric: Mood and affect are normal. Speech and behavior are normal. Patient exhibits appropriate insight and judgement.   ____________________________________________   LABS  None ____________________________________________  EKG  None ____________________________________________  RADIOLOGY  None ____________________________________________    PROCEDURES  Procedure(s) performed: None   Procedures   Medications - No data to display   ____________________________________________   INITIAL IMPRESSION / ASSESSMENT AND PLAN / ED COURSE  Pertinent labs & imaging results that were available during my care of the patient were reviewed by me and considered in my medical decision making (see chart for details).     Patient's diagnosis is consistent with influenza. Patient will be discharged home with prescriptions for Tamiflu, Flonase and promethazine DM to take as directed. Patient is to follow up with [facility] or your primary care provider if symptoms persist past this treatment course. Patient is given ED precautions to return to the ED for any worsening or new symptoms.   ____________________________________________  FINAL CLINICAL IMPRESSION(S) / ED DIAGNOSES  Final diagnoses:  Influenza      NEW MEDICATIONS STARTED DURING THIS VISIT:  Discharge Medication List as of 01/03/2017 12:17 PM    START taking these medications   Details  fluticasone (FLONASE) 50 MCG/ACT nasal spray Place 2 sprays into both nostrils daily., Starting Wed 01/03/2017, Print    oseltamivir (TAMIFLU) 75 MG capsule Take 1 capsule (75 mg total) by mouth 2 (two) times daily., Starting Wed 01/03/2017, Print    promethazine-dextromethorphan (PROMETHAZINE-DM) 6.25-15 MG/5ML syrup Take 5 mLs by mouth 4 (four) times  daily as needed for cough., Starting Wed 01/03/2017, Print             Ernestene Kiel Golva, PA-C 01/04/17 1610    Arnaldo Natal, MD 01/08/17 1451

## 2017-01-03 NOTE — ED Triage Notes (Signed)
Pt reports flu like sx's that began 2 days ago.  

## 2017-08-14 ENCOUNTER — Emergency Department
Admission: EM | Admit: 2017-08-14 | Discharge: 2017-08-14 | Disposition: A | Discharge status: Home / Self Care | Payer: BLUE CROSS/BLUE SHIELD | Attending: Student in an Organized Health Care Education/Training Program | Admitting: Student in an Organized Health Care Education/Training Program

## 2017-08-14 ENCOUNTER — Encounter: Payer: Self-pay | Admitting: Medical Oncology

## 2017-08-14 DIAGNOSIS — S39012A Strain of muscle, fascia and tendon of lower back, initial encounter: Secondary | ICD-10-CM

## 2017-08-14 DIAGNOSIS — M5441 Lumbago with sciatica, right side: Secondary | ICD-10-CM | POA: Insufficient documentation

## 2017-08-14 DIAGNOSIS — X58XXXA Exposure to other specified factors, initial encounter: Secondary | ICD-10-CM | POA: Insufficient documentation

## 2017-08-14 DIAGNOSIS — F17210 Nicotine dependence, cigarettes, uncomplicated: Secondary | ICD-10-CM | POA: Insufficient documentation

## 2017-08-14 DIAGNOSIS — M5442 Lumbago with sciatica, left side: Secondary | ICD-10-CM | POA: Insufficient documentation

## 2017-08-14 DIAGNOSIS — M5440 Lumbago with sciatica, unspecified side: Secondary | ICD-10-CM

## 2017-08-14 DIAGNOSIS — Y999 Unspecified external cause status: Secondary | ICD-10-CM | POA: Insufficient documentation

## 2017-08-14 DIAGNOSIS — Y939 Activity, unspecified: Secondary | ICD-10-CM | POA: Insufficient documentation

## 2017-08-14 DIAGNOSIS — I10 Essential (primary) hypertension: Secondary | ICD-10-CM | POA: Insufficient documentation

## 2017-08-14 DIAGNOSIS — Y929 Unspecified place or not applicable: Secondary | ICD-10-CM | POA: Insufficient documentation

## 2017-08-14 DIAGNOSIS — Z79899 Other long term (current) drug therapy: Secondary | ICD-10-CM | POA: Insufficient documentation

## 2017-08-14 MED ORDER — DEXAMETHASONE SODIUM PHOSPHATE 10 MG/ML IJ SOLN
10.0000 mg | Freq: Once | INTRAMUSCULAR | Status: AC
  Administered 2017-08-14: 10 mg via INTRAMUSCULAR
  Filled 2017-08-14: qty 1

## 2017-08-14 MED ORDER — PREDNISONE 10 MG (21) PO TBPK: ORAL_TABLET | ORAL | 0 refills | Status: DC

## 2017-08-14 MED ORDER — CYCLOBENZAPRINE HCL 10 MG PO TABS: 10.0000 mg | ORAL_TABLET | Freq: Two times a day (BID) | ORAL | 0 refills | Status: AC | PRN
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
## 2017-08-14 NOTE — Discharge Instructions (Signed)
Take medication as prescribed. Return to emergency department if symptoms worsen and follow-up with PCP as needed.   °
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
## 2017-08-14 NOTE — ED Triage Notes (Signed)
P{t reports chronic lower back pain since 2013 and rt knee pain since an injury around Christmas time.

## 2018-08-14 ENCOUNTER — Emergency Department
Admission: EM | Admit: 2018-08-14 | Discharge: 2018-08-14 | Disposition: A | Discharge status: Home / Self Care | Payer: BLUE CROSS/BLUE SHIELD | Attending: Emergency Medicine | Admitting: Emergency Medicine

## 2018-08-14 ENCOUNTER — Encounter: Payer: Self-pay | Admitting: Emergency Medicine

## 2018-08-14 ENCOUNTER — Other Ambulatory Visit: Payer: Self-pay

## 2018-08-14 ENCOUNTER — Emergency Department: Payer: BLUE CROSS/BLUE SHIELD

## 2018-08-14 DIAGNOSIS — F17210 Nicotine dependence, cigarettes, uncomplicated: Secondary | ICD-10-CM | POA: Insufficient documentation

## 2018-08-14 DIAGNOSIS — I10 Essential (primary) hypertension: Secondary | ICD-10-CM | POA: Insufficient documentation

## 2018-08-14 DIAGNOSIS — Z79899 Other long term (current) drug therapy: Secondary | ICD-10-CM | POA: Diagnosis not present

## 2018-08-14 DIAGNOSIS — M79604 Pain in right leg: Secondary | ICD-10-CM | POA: Diagnosis present

## 2018-08-14 DIAGNOSIS — I83811 Varicose veins of right lower extremities with pain: Secondary | ICD-10-CM | POA: Diagnosis not present

## 2018-08-14 NOTE — ED Provider Notes (Signed)
[facility] Emergency Department Provider Note  ____________________________________________  Time seen: Approximately 9:15 PM  I have reviewed the triage vital signs and the nursing notes.   HISTORY  Chief Complaint Leg Pain   HPI Renee Barrera is a 32 y.o. female with a history of hypertension who presents for evaluation of right leg pain and swelling. Patient reports that her symptoms started today while at work. She stands for most of the day. She noted a bulging vein in the back of her right calf and is complaining of associated burning constant pain that is worse with palpation and weightbearing. No weakness or paresthesias of her extremities, no back pain or abdominal pain, no pain on the left leg. No personal or family history of blood clots, recent travel immobilization, leg swelling, or exogenous hormones. No chest pain or shortness of breath.  Past Medical History:  Diagnosis Date  . Hypertension      Prior to Admission medications   Medication Sig Start Date End Date Taking? Authorizing Provider  calcium-vitamin D (OSCAL WITH D) 500-200 MG-UNIT tablet Take 1 tablet by mouth.    [provider]  cyclobenzaprine (FLEXERIL) 10 MG tablet Take 1 tablet (10 mg total) by mouth 2 (two) times daily as needed for muscle spasms. 08/14/17   Little, Traci M, PA-C  fluticasone (FLONASE) 50 MCG/ACT nasal spray Place 2 sprays into both nostrils daily. 01/03/17   Hagler, Jami L, PA-C  oseltamivir (TAMIFLU) 75 MG capsule Take 1 capsule (75 mg total) by mouth 2 (two) times daily. 01/03/17   Hagler, Jami L, PA-C  predniSONE (STERAPRED UNI-PAK 21 TAB) 10 MG (21) TBPK tablet Take 6 tablets on day 1. Take 5 tablets on day 2. Take 4 tablets on day 3. Take 3 tablets on day 4. Take 2 tablets on day 5. Take 1 tablets on day 6. 08/14/17   Little, Traci M, PA-C  promethazine-dextromethorphan (PROMETHAZINE-DM) 6.25-15 MG/5ML syrup Take 5 mLs by mouth 4 (four) times  daily as needed for cough. 01/03/17   Hagler, Jami L, PA-C    Allergies Patient has no known allergies.  No family history on file.  Social History Social History   Tobacco Use  . Smoking status: Current Every Day Smoker    Packs/day: 1.00    Types: Cigarettes  Substance Use Topics  . Alcohol use: No  . Drug use: No    Review of Systems  Constitutional: Negative for fever. Eyes: Negative for visual changes. ENT: Negative for sore throat. Neck: No neck pain  Cardiovascular: Negative for chest pain. Respiratory: Negative for shortness of breath. Gastrointestinal: Negative for abdominal pain, vomiting or diarrhea. Genitourinary: Negative for dysuria. Musculoskeletal: Negative for back pain. + RLE pain  Skin: Negative for rash. Neurological: Negative for headaches, weakness or numbness. Psych: No SI or HI  ____________________________________________   PHYSICAL EXAM:  VITAL SIGNS: ED Triage Vitals [08/14/18 1852]  Enc Vitals Group     BP (!) 153/88     Pulse Rate (!) 105     Resp 16     Temp 99 F (37.2 C)     Temp Source Oral     SpO2 100 %     Weight 100 lb (45.4 kg)     Height 5\' 2"  (1.575 m)     Head Circumference      Peak Flow      Pain Score 10     Pain Loc      Pain Edu?  Excl. in GC?     Constitutional: Alert and oriented. Well appearing and in no apparent distress. HEENT:      Head: Normocephalic and atraumatic.         Eyes: Conjunctivae are normal. Sclera is non-icteric.       Mouth/Throat: Mucous membranes are moist.       Neck: Supple with no signs of meningismus. Cardiovascular: Regular rate and rhythm. No murmurs, gallops, or rubs. 2+ symmetrical distal pulses are present in all extremities. No JVD. Respiratory: Normal respiratory effort. Lungs are clear to auscultation bilaterally. No wheezes, crackles, or rhonchi.  Gastrointestinal: Soft, non tender, and non distended with positive bowel sounds. No rebound or  guarding. Musculoskeletal: Nontender with normal range of motion in all extremities. No edema, cyanosis, or erythema of extremities. There is superficial varicose veins located on the R calf. DP and PT pulses are present and normal Neurologic: Normal speech and language. Face is symmetric. Moving all extremities. No gross focal neurologic deficits are appreciated. Skin: Skin is warm, dry and intact. No rash noted. Psychiatric: Mood and affect are normal. Speech and behavior are normal.  ____________________________________________   LABS (all labs ordered are listed, but only abnormal results are displayed)  Labs Reviewed - No data to display ____________________________________________  EKG  none  ____________________________________________  RADIOLOGY  I have personally reviewed the images performed during this visit and I agree with the Radiologist's read.   Interpretation by Radiologist:  [place] Venous Img Lower Unilateral Right  Result Date: 08/14/2018 CLINICAL DATA:  Right leg pain EXAM: RIGHT LOWER EXTREMITY VENOUS DOPPLER ULTRASOUND TECHNIQUE: Gray-scale sonography with graded compression, as well as color Doppler and duplex ultrasound were performed to evaluate the lower extremity deep venous systems from the level of the common femoral vein and including the common femoral, femoral, profunda femoral, popliteal and calf veins including the posterior tibial, peroneal and gastrocnemius veins when visible. The superficial great saphenous vein was also interrogated. Spectral Doppler was utilized to evaluate flow at rest and with distal augmentation maneuvers in the common femoral, femoral and popliteal veins. COMPARISON:  None. FINDINGS: Contralateral Common Femoral Vein: Respiratory phasicity is normal and symmetric with the symptomatic side. No evidence of thrombus. Normal compressibility. Common Femoral Vein: No evidence of thrombus. Normal compressibility, respiratory phasicity and  response to augmentation. Saphenofemoral Junction: No evidence of thrombus. Normal compressibility and flow on color Doppler imaging. Profunda Femoral Vein: No evidence of thrombus. Normal compressibility and flow on color Doppler imaging. Femoral Vein: No evidence of thrombus. Normal compressibility, respiratory phasicity and response to augmentation. Popliteal Vein: No evidence of thrombus. Normal compressibility, respiratory phasicity and response to augmentation. Calf Veins: No evidence of thrombus. Normal compressibility and flow on color Doppler imaging. Superficial Great Saphenous Vein: No evidence of thrombus. Normal compressibility. Venous Reflux:  None. Other Findings:  None. IMPRESSION: No evidence of deep venous thrombosis. Electronically Signed   By: Charlett NoseKevin  Dover M.D.   On: 08/14/2018 19:40     ____________________________________________   PROCEDURES  Procedure(s) performed: None Procedures Critical Care performed:  None ____________________________________________   INITIAL IMPRESSION / ASSESSMENT AND PLAN / ED COURSE   32 y.o. female with a history of hypertension who presents for evaluation of right leg pain and swelling.patient with bulging superficial veins located on her right calf concerning for varicose veins, no overlying erythema and no clinical signs of cellulitis or phlebitis. Doppler ultrasound negative for DVT. Recommended NSAIDs, compression stockings and elevation of the leg. Recommended close follow-up with  primary care doctor in 2 days. Recommend return to the emergency room if she develops any signs of phlebitis or cellulitis.      As part of my medical decision making, I reviewed the following data within the electronic MEDICAL RECORD NUMBER Nursing notes reviewed and incorporated, Labs reviewed , Old chart reviewed, Radiograph reviewed , Notes from prior ED visits and [place] Controlled Substance Database    Pertinent labs & imaging results that were available during  my care of the patient were reviewed by me and considered in my medical decision making (see chart for details).    ____________________________________________   FINAL CLINICAL IMPRESSION(S) / ED DIAGNOSES  Final diagnoses:  Varicose veins of leg with pain, right      NEW MEDICATIONS STARTED DURING THIS VISIT:  ED Discharge Orders    None       Note:  This document was prepared using Dragon voice recognition software and may include unintentional dictation errors.    Nita Sickle, MD 08/14/18 2125
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
## 2018-08-14 NOTE — Discharge Instructions (Signed)
Use compression stockings, take ibuprofen 600mg  every 6 hours for the next few days, elevate your leg several times a day. If your symptoms are persistent, follow up with your doctor. Return to the ER if the pain is worse, if you have abdominal pain, back pain, weakness of your leg, fever, or redness of the leg.

## 2019-05-14 MED ADMIN — lactated Ringers intravenous solution: INTRAVENOUS | @ 10:00:00 | NDC 00338011704

## 2019-12-09 ENCOUNTER — Other Ambulatory Visit: Payer: Self-pay

## 2019-12-09 ENCOUNTER — Emergency Department
Admission: EM | Admit: 2019-12-09 | Discharge: 2019-12-09 | Disposition: A | Discharge status: Home / Self Care | Payer: BC Managed Care – PPO | Attending: Emergency Medicine | Admitting: Emergency Medicine

## 2019-12-09 ENCOUNTER — Emergency Department: Payer: BC Managed Care – PPO

## 2019-12-09 ENCOUNTER — Encounter: Payer: Self-pay | Admitting: Emergency Medicine

## 2019-12-09 DIAGNOSIS — I10 Essential (primary) hypertension: Secondary | ICD-10-CM | POA: Insufficient documentation

## 2019-12-09 DIAGNOSIS — S43421A Sprain of right rotator cuff capsule, initial encounter: Secondary | ICD-10-CM | POA: Diagnosis not present

## 2019-12-09 DIAGNOSIS — Y939 Activity, unspecified: Secondary | ICD-10-CM | POA: Diagnosis not present

## 2019-12-09 DIAGNOSIS — X58XXXA Exposure to other specified factors, initial encounter: Secondary | ICD-10-CM | POA: Insufficient documentation

## 2019-12-09 DIAGNOSIS — F17210 Nicotine dependence, cigarettes, uncomplicated: Secondary | ICD-10-CM | POA: Diagnosis not present

## 2019-12-09 DIAGNOSIS — Y999 Unspecified external cause status: Secondary | ICD-10-CM | POA: Insufficient documentation

## 2019-12-09 DIAGNOSIS — S4991XA Unspecified injury of right shoulder and upper arm, initial encounter: Secondary | ICD-10-CM | POA: Diagnosis present

## 2019-12-09 DIAGNOSIS — Y929 Unspecified place or not applicable: Secondary | ICD-10-CM | POA: Insufficient documentation

## 2019-12-09 MED ORDER — TRAMADOL HCL 50 MG PO TABS: 50.0000 mg | ORAL_TABLET | Freq: Four times a day (QID) | ORAL | 0 refills | Status: AC | PRN

## 2019-12-09 MED ORDER — NAPROXEN 500 MG PO TABS: 500.0000 mg | ORAL_TABLET | Freq: Two times a day (BID) | ORAL | Status: AC
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
## 2019-12-09 NOTE — ED Provider Notes (Signed)
[facility] Emergency Department Provider Note   ____________________________________________   First MD Initiated Contact with Patient 12/09/19 1231     (approximate)  I have reviewed the triage vital signs and the nursing notes.   HISTORY  Chief Complaint Shoulder Pain    HPI Renee Barrera is a 34 y.o. female patient complain of right shoulder pain.  Patient state past history of rotator cuff injury years ago while playing softball.  Patient states no surgical correction.    Patient now presents with a burning pain to the right shoulder.  Patient the pain increased with extension and abduction of the shoulder.  Patient denies loss of sensation.  Patient pain increases secondary to her job which requires repetitive lifting overhead reaching.  Patient is right-hand dominant.  Patient rates pain as a 9/10.  Patient described the pain as "burning/sharp".  No palliative measure for complaint.         Past Medical History:  Diagnosis Date  . Hypertension     There are no problems to display for this patient.   History reviewed. No pertinent surgical history.  Prior to Admission medications   Medication Sig Start Date End Date Taking? Authorizing Provider  calcium-vitamin D (OSCAL WITH D) 500-200 MG-UNIT tablet Take 1 tablet by mouth.    [provider]  cyclobenzaprine (FLEXERIL) 10 MG tablet Take 1 tablet (10 mg total) by mouth 2 (two) times daily as needed for muscle spasms. 08/14/17   Little, Traci M, PA-C  fluticasone (FLONASE) 50 MCG/ACT nasal spray Place 2 sprays into both nostrils daily. 01/03/17   Hagler, Jami L, PA-C  naproxen (NAPROSYN) 500 MG tablet Take 1 tablet (500 mg total) by mouth 2 (two) times daily with a meal. 12/09/19   Joni Reining, PA-C  traMADol (ULTRAM) 50 MG tablet Take 1 tablet (50 mg total) by mouth every 6 (six) hours as needed for moderate pain. 12/09/19   Joni Reining, PA-C    Allergies Patient has no  known allergies.  History reviewed. No pertinent family history.  Social History Social History   Tobacco Use  . Smoking status: Current Every Day Smoker    Packs/day: 1.00    Types: Cigarettes  . Smokeless tobacco: Never Used  Substance Use Topics  . Alcohol use: No  . Drug use: No    Review of Systems Constitutional: No fever/chills Eyes: No visual changes. ENT: No sore throat. Cardiovascular: Denies chest pain. Respiratory: Denies shortness of breath. Gastrointestinal: No abdominal pain.  No nausea, no vomiting.  No diarrhea.  No constipation. Genitourinary: Negative for dysuria. Musculoskeletal: Right shoulder pain.   Skin: Negative for rash. Neurological: Negative for headaches, focal weakness or numbness.   ____________________________________________   PHYSICAL EXAM:  VITAL SIGNS: ED Triage Vitals [12/09/19 1145]  Enc Vitals Group     BP 126/90     Pulse Rate 88     Resp 14     Temp 98.6 F (37 C)     Temp Source Oral     SpO2 100 %     Weight 100 lb (45.4 kg)     Height 5\' 2"  (1.575 m)     Head Circumference      Peak Flow      Pain Score 9     Pain Loc      Pain Edu?      Excl. in GC?    Constitutional: Alert and oriented. Well appearing and in no acute  distress. Cardiovascular: Normal rate, regular rhythm. Grossly normal heart sounds.  Good peripheral circulation. Respiratory: Normal respiratory effort.  No retractions. Lungs CTAB. Musculoskeletal: No obvious deformities of the right shoulder.  Patient has moderate guarding palpation dysuria posterior aspect of the shoulder.  Decreased range of motion for abduction overhead reaching.   Neurologic:  Normal speech and language. No gross focal neurologic deficits are appreciated. No gait instability. Skin:  Skin is warm, dry and intact. No rash noted. Psychiatric: Mood and affect are normal. Speech and behavior are normal.  ____________________________________________   LABS (all labs ordered  are listed, but only abnormal results are displayed)  Labs Reviewed - No data to display ____________________________________________  EKG   ____________________________________________  RADIOLOGY  ED MD interpretation:    Official radiology report(s): DG Shoulder Right  Result Date: 12/09/2019 CLINICAL DATA:  Shoulder pain. EXAM: RIGHT SHOULDER - 2+ VIEW COMPARISON:  04/21/2010. FINDINGS: No acute bony or joint abnormality identified. No evidence of fracture or dislocation. IMPRESSION: No acute abnormality. Electronically Signed   By: Marcello Moores  Register   On: 12/09/2019 12:11    ____________________________________________   PROCEDURES  Procedure(s) performed (including Critical Care):  Procedures   ____________________________________________   INITIAL IMPRESSION / ASSESSMENT AND PLAN / ED COURSE  As part of my medical decision making, I reviewed the following data within the [facility]      Patient presents with right shoulder pain for past history of rotator cuff injury.  Physical exam reveals decreased range of motion with abduction and overhead reaching limited by complaint of pain.  Physical exam consistent with shoulder sprain versus rotator cuff injury.  Patient placed in arm sling and given discharge care instructions.  Patient follow-up with orthopedic for definitive evaluation and treatment.   Renee Barrera was evaluated in Emergency Department on 12/09/2019 for the symptoms described in the history of present illness. She was evaluated in the context of the global COVID-19 pandemic, which necessitated consideration that the patient might be at risk for infection with the SARS-CoV-2 virus that causes COVID-19. Institutional protocols and algorithms that pertain to the evaluation of patients at risk for COVID-19 are in a state of rapid change based on information released by regulatory bodies including the CDC and federal and state organizations.  These policies and algorithms were followed during the patient's care in the ED.       ____________________________________________   FINAL CLINICAL IMPRESSION(S) / ED DIAGNOSES  Final diagnoses:  Sprain of right rotator cuff capsule, initial encounter     ED Discharge Orders         Ordered    naproxen (NAPROSYN) 500 MG tablet  2 times daily with meals     12/09/19 1247    traMADol (ULTRAM) 50 MG tablet  Every 6 hours PRN     12/09/19 1247           Note:  This document was prepared using Dragon voice recognition software and may include unintentional dictation errors.    Sable Feil, PA-C 12/09/19 1253    Carrie Mew, MD 12/09/19 [phone]

## 2019-12-09 NOTE — Discharge Instructions (Signed)
Wear arm sling and take medication as directed. °

## 2019-12-09 NOTE — ED Triage Notes (Signed)
Had injury to rotator cuff when used to play softball.  Lifts heavy things at work.  Now c/o pain and burning in right shoulder. Pain worse with extension from shoulder.

## 2020-01-09 ENCOUNTER — Other Ambulatory Visit: Payer: Self-pay | Admitting: Orthopedic Surgery

## 2020-01-09 DIAGNOSIS — M25511 Pain in right shoulder: Secondary | ICD-10-CM

## 2020-01-09 DIAGNOSIS — S12100A Unspecified displaced fracture of second cervical vertebra, initial encounter for closed fracture: Secondary | ICD-10-CM

## 2020-01-22 ENCOUNTER — Ambulatory Visit
Admission: RE | Admit: 2020-01-22 | Discharge: 2020-01-22 | Disposition: A | Discharge status: Home / Self Care | Payer: BC Managed Care – PPO | Source: Ambulatory Visit | Attending: Orthopedic Surgery | Admitting: Orthopedic Surgery

## 2020-01-22 ENCOUNTER — Other Ambulatory Visit: Payer: Self-pay

## 2020-01-22 DIAGNOSIS — S12100A Unspecified displaced fracture of second cervical vertebra, initial encounter for closed fracture: Secondary | ICD-10-CM | POA: Insufficient documentation

## 2020-01-22 DIAGNOSIS — Y929 Unspecified place or not applicable: Secondary | ICD-10-CM | POA: Insufficient documentation

## 2020-01-22 DIAGNOSIS — Y999 Unspecified external cause status: Secondary | ICD-10-CM | POA: Insufficient documentation

## 2020-01-22 DIAGNOSIS — M25511 Pain in right shoulder: Secondary | ICD-10-CM | POA: Insufficient documentation

## 2020-01-22 DIAGNOSIS — Y939 Activity, unspecified: Secondary | ICD-10-CM | POA: Diagnosis not present

## 2020-01-22 MED ORDER — GADOBUTROL 1 MMOL/ML IV SOLN
0.0500 mL | Freq: Once | INTRAVENOUS | Status: AC | PRN
  Administered 2020-01-22: 0.05 mL

## 2020-01-22 MED ORDER — IOHEXOL 180 MG/ML  SOLN
15.0000 mL | Freq: Once | INTRAMUSCULAR | Status: AC | PRN
  Administered 2020-01-22: 5 mL

## 2020-01-22 MED ORDER — LIDOCAINE HCL (PF) 1 % IJ SOLN
5.0000 mL | Freq: Once | INTRAMUSCULAR | Status: AC
  Administered 2020-01-22: 5 mL
  Filled 2020-01-22: qty 5

## 2020-01-22 MED ORDER — SODIUM CHLORIDE (PF) 0.9 % IJ SOLN
5.0000 mL | INTRAMUSCULAR | Status: DC | PRN
  Administered 2020-01-22: 15 mL

## 2021-12-31 IMAGING — CR DG SHOULDER 2+V*R*
1 series · 3 of 3 positions shown · non-contrast
Comparison: 04/21/2010.

CLINICAL DATA: Shoulder pain.

EXAM:
RIGHT SHOULDER - 2+ VIEW

[Series 1: dg shoulder right · 0.14mm/px · 3 of 3 slices shown]
[im 1/3]
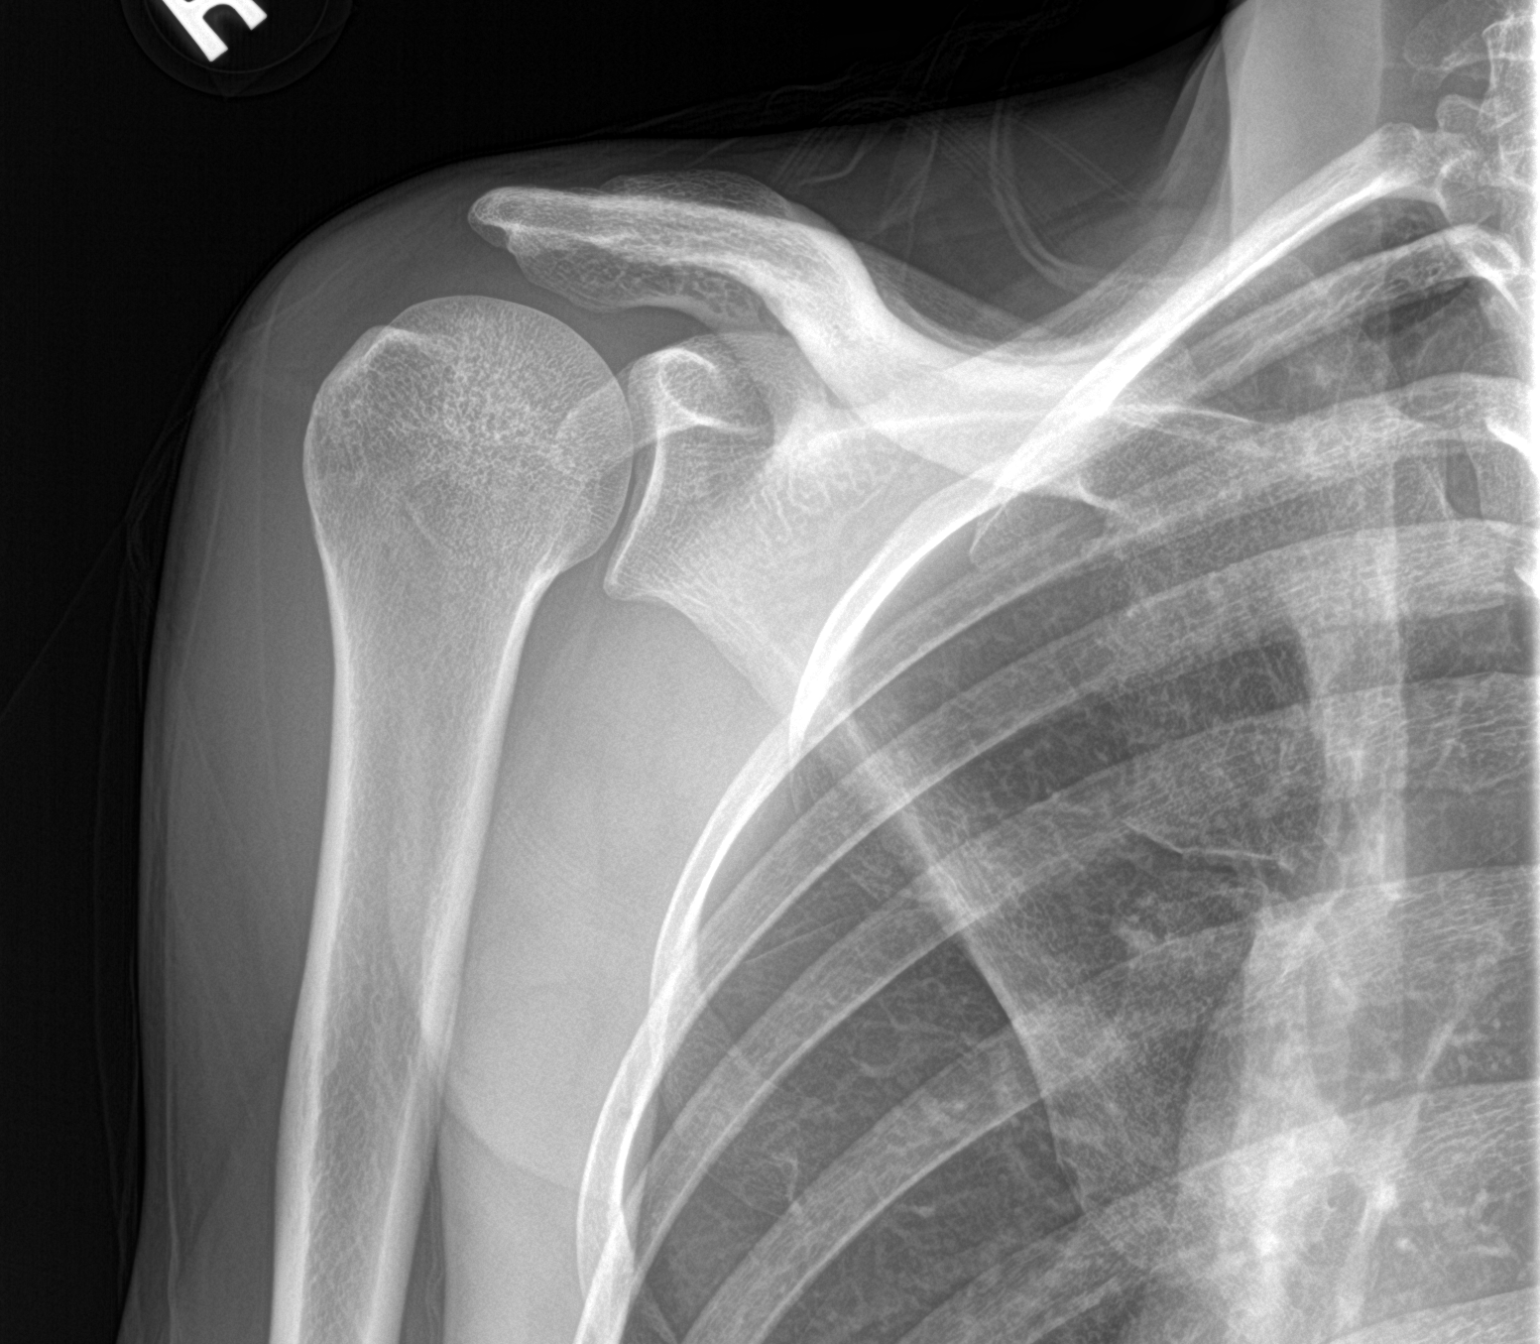
[im 2/3]
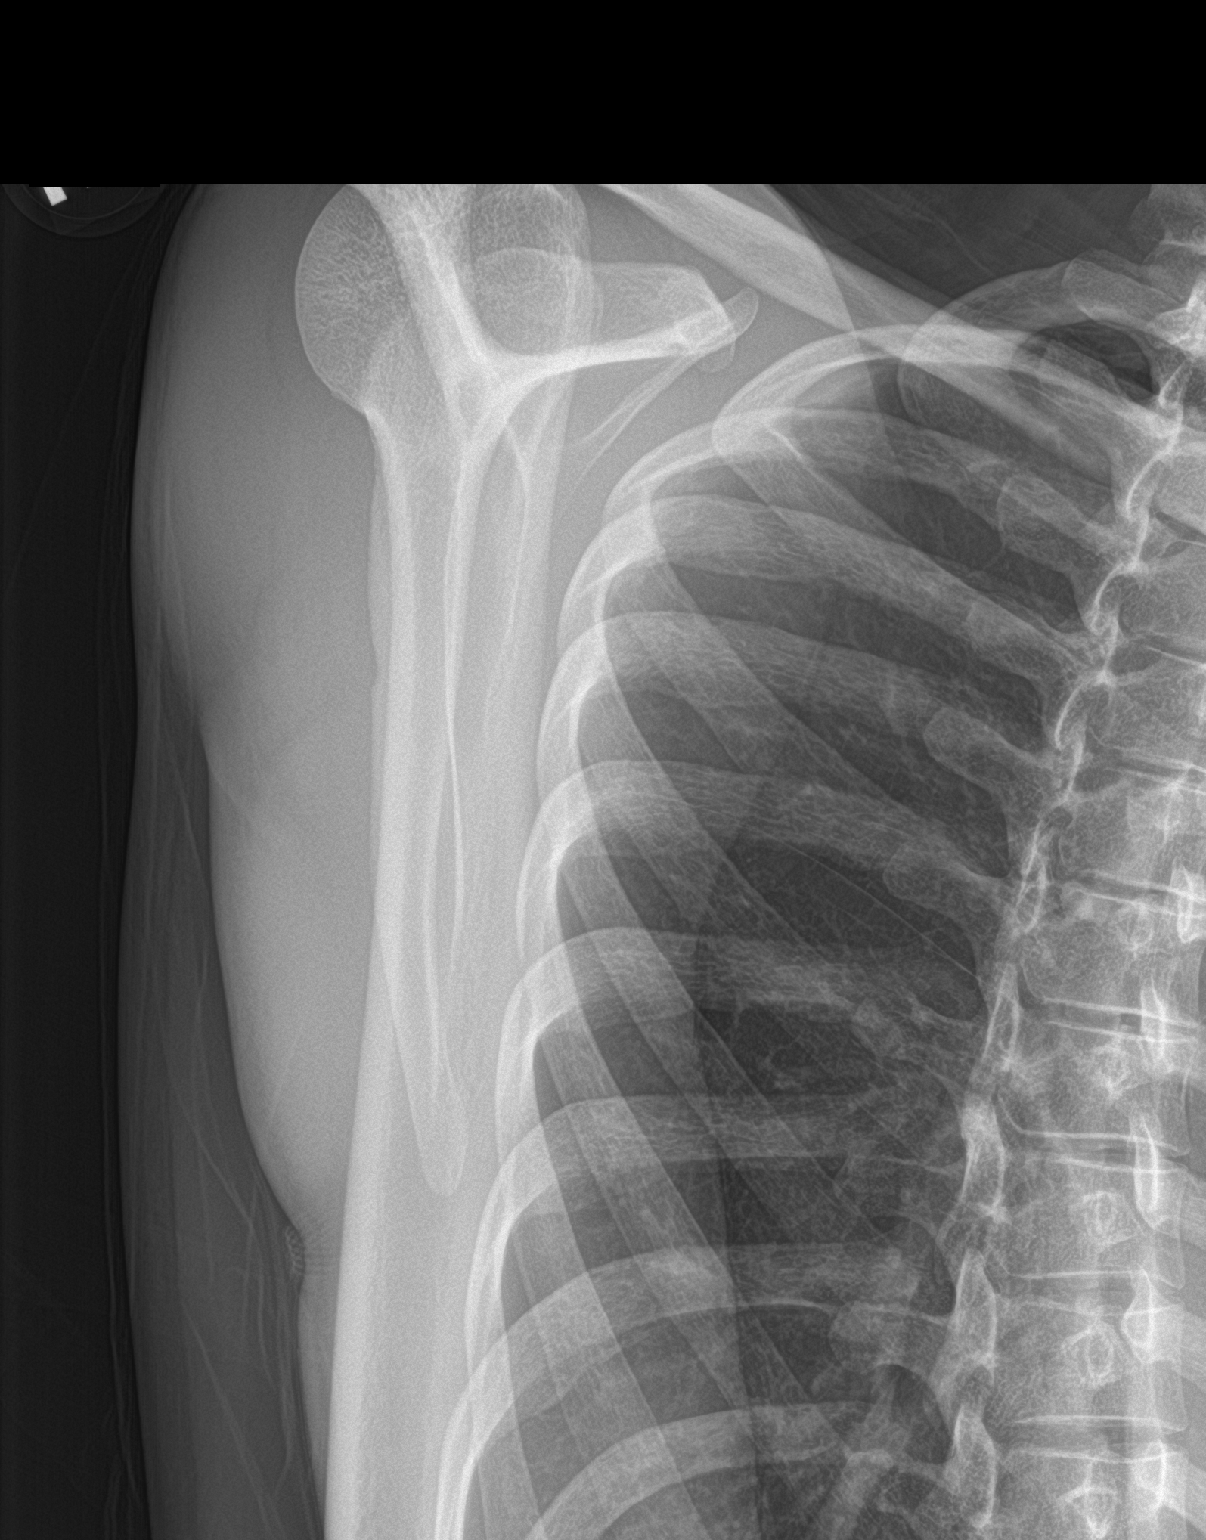
[im 3/3]
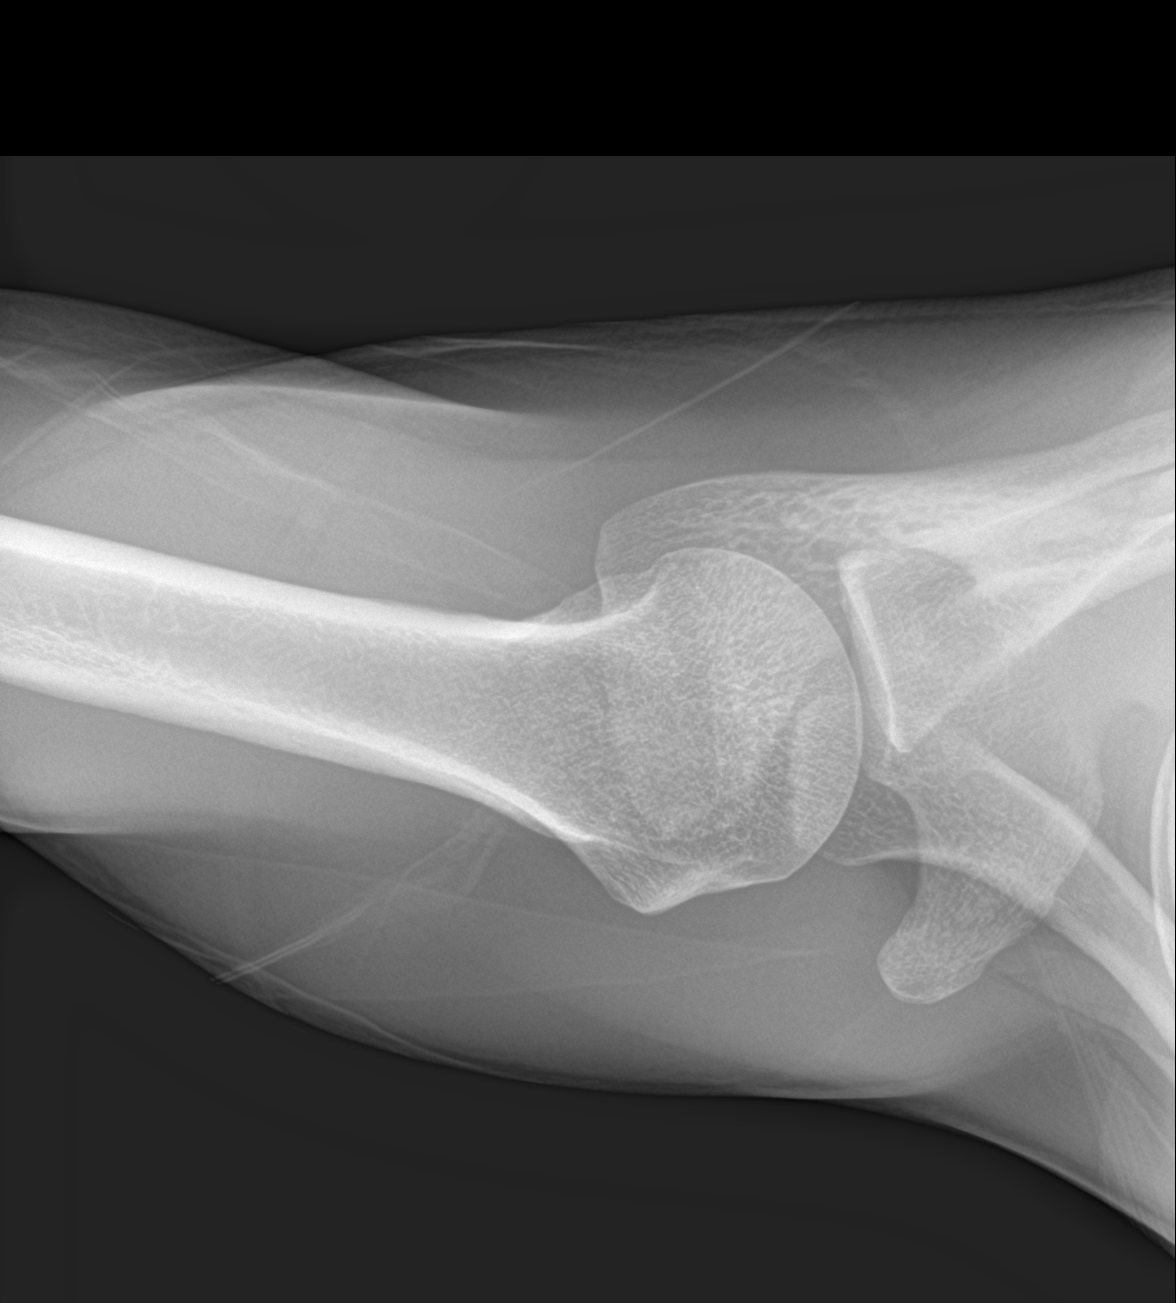

[3 of 3 positions shown; findings below may reference images not displayed]

FINDINGS: No acute bony or joint abnormality identified. No evidence of
fracture or dislocation.
IMPRESSION: No acute abnormality.

## 2022-02-13 IMAGING — RF DG FLUORO GUIDE NDL PLC/BX
2 series · 3 of 3 positions shown · IV contrast (gadolinium)
Comparison: none

CLINICAL DATA: Right shoulder pain.

EXAM:
RIGHT SHOULDER INJECTION UNDER FLUOROSCOPY
TECHNIQUE: After discussing the risks and benefits of this procedure the
patient informed consent was obtained. Right shoulder sterilely
prepped and draped. Following local anesthesia 1% lidocaine 22 gauge
spinal needle was advanced into the right shoulder joint under
fluoroscopic guidance. Standardized mixture of sterile saline,
nonionic contrast, and gadolinium administered. Patient sent to MRI
in stable condition.
FLUOROSCOPY TIME:  Fluoroscopy Time:  1 minutes 0 seconds
Radiation Exposure Index (if provided by the fluoroscopic device):
3.5 mGy
Number of Acquired Spot Images: 2

[Series 1: fluoro_iodine 2fps_bw · 0.17mm/px · 2 of 2 frames shown]
[frame 1/2]
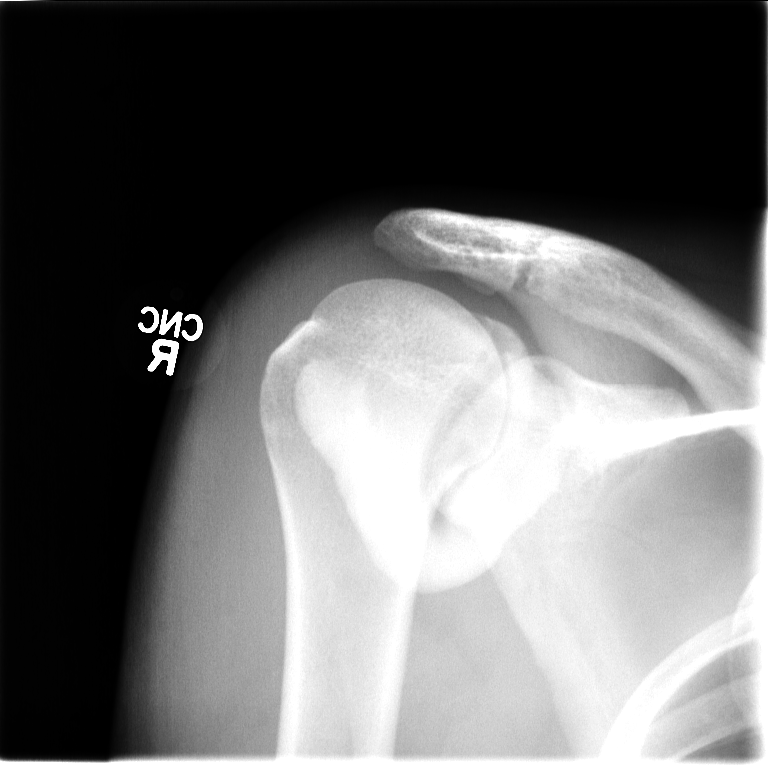
[frame 2/2]
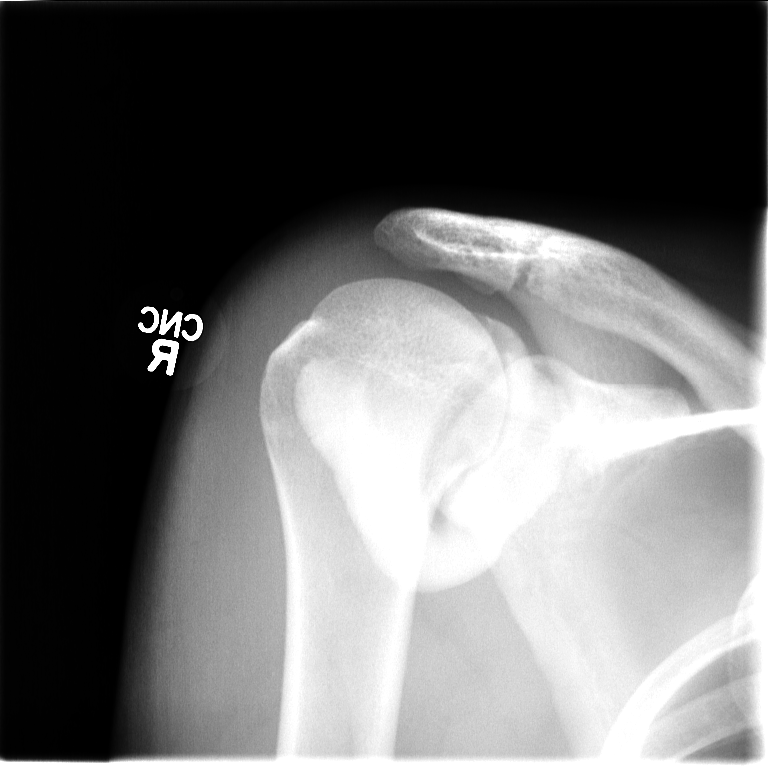

[Series 2: cp_standard · 0.17mm/px · 1 of 1 slices shown]
[im 1/1]
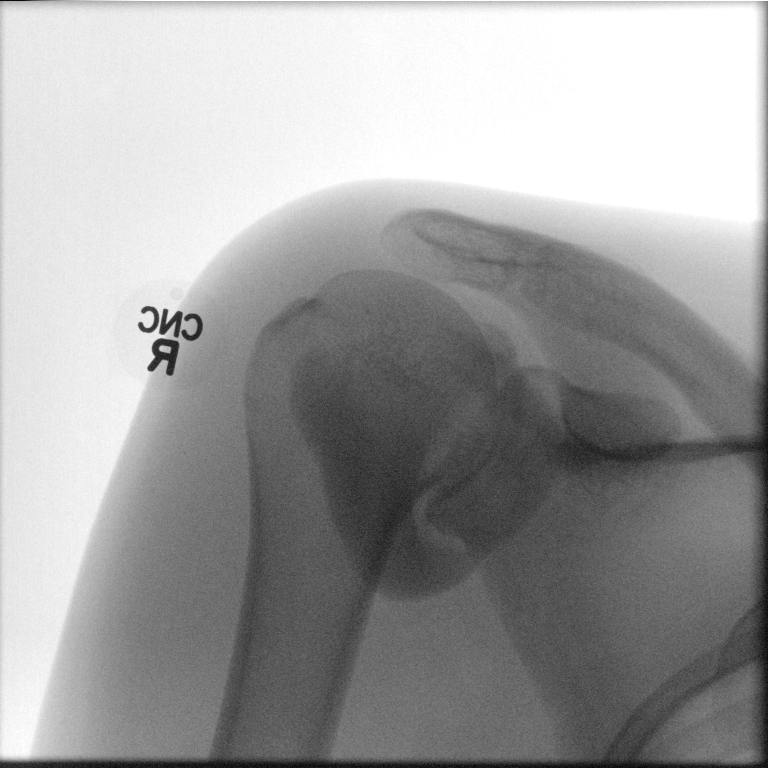

[3 of 3 positions shown; findings below may reference images not displayed]

FINDINGS: Successful right shoulder injection using fluoroscopic guidance. No
complications.
IMPRESSION: Technically successful right shoulder injection for MRI.

## 2023-08-22 ENCOUNTER — Other Ambulatory Visit: Payer: Self-pay

## 2023-08-22 ENCOUNTER — Emergency Department
Admission: EM | Admit: 2023-08-22 | Discharge: 2023-08-22 | Disposition: A | Discharge status: Home / Self Care | Payer: BC Managed Care – PPO | Attending: Emergency Medicine | Admitting: Emergency Medicine

## 2023-08-22 ENCOUNTER — Emergency Department: Payer: BC Managed Care – PPO

## 2023-08-22 DIAGNOSIS — M79604 Pain in right leg: Secondary | ICD-10-CM | POA: Diagnosis present

## 2023-08-22 DIAGNOSIS — I10 Essential (primary) hypertension: Secondary | ICD-10-CM | POA: Diagnosis not present

## 2023-08-22 DIAGNOSIS — I8391 Asymptomatic varicose veins of right lower extremity: Secondary | ICD-10-CM | POA: Diagnosis not present

## 2023-08-22 DIAGNOSIS — Z87891 Personal history of nicotine dependence: Secondary | ICD-10-CM | POA: Insufficient documentation

## 2023-08-22 DIAGNOSIS — I8390 Asymptomatic varicose veins of unspecified lower extremity: Secondary | ICD-10-CM

## 2023-08-22 NOTE — ED Notes (Signed)
See triage notes. Patient concerned about blood vessels bursting in the back of her right calf

## 2023-08-22 NOTE — ED Provider Notes (Signed)
[facility] Provider Note    Event Date/Time   First MD Initiated Contact with Patient 08/22/23 1705     (approximate)   History   Blood Vessels Busting   HPI  Renee Barrera is a 37 y.o. female with history of hypertension presents emergency department right leg pain.  States her vessels in her calf seem to be bulging and breaking.  States pain in the back of her calf.  Patient was a previous smoker.  Denies chest pain or shortness of breath      Physical Exam   Triage Vital Signs: ED Triage Vitals  Encounter Vitals Group     BP 08/22/23 1615 (!) 159/89     Systolic BP Percentile --      Diastolic BP Percentile --      Pulse Rate 08/22/23 1615 77     Resp 08/22/23 1615 18     Temp 08/22/23 1615 98.4 F (36.9 C)     Temp Source 08/22/23 1615 Oral     SpO2 08/22/23 1615 100 %     Weight --      Height --      Head Circumference --      Peak Flow --      Pain Score 08/22/23 1619 9     Pain Loc --      Pain Education --      Exclude from Growth Chart --     Most recent vital signs: Vitals:   08/22/23 1615  BP: (!) 159/89  Pulse: 77  Resp: 18  Temp: 98.4 F (36.9 C)  SpO2: 100%     General: Awake, no distress.   CV:  Good peripheral perfusion. regular rate and  rhythm Resp:  Normal effort.  Abd:  No distention.   Other:  Right calf tender to palpation, varicose veins noted externally around the lateral aspect, neurovascular intact   ED Results / Procedures / Treatments   Labs (all labs ordered are listed, but only abnormal results are displayed) Labs Reviewed - No data to display   EKG     RADIOLOGY Ultrasound right lower extremity for DVT    PROCEDURES:   Procedures   MEDICATIONS ORDERED IN ED: Medications - No data to display   IMPRESSION / MDM / ASSESSMENT AND PLAN / ED COURSE  I reviewed the triage vital signs and the nursing notes.                              Differential diagnosis includes,  but is not limited to, DVT, varicose veins, muscle strain  Patient's presentation is most consistent with acute complicated illness / injury requiring diagnostic workup.   Due to possible DVT ordered ultrasound right lower extremity  I did independently review and interpret the ultrasound as being negative for any acute abnormality  Was explained findings to patient.  Encouraged her to buy compression socks.  Follow-up with her regular doctor as needed.  Follow-up with vascular if worsening.  She is in agreement treatment plan.  Discharged stable condition with work note      FINAL CLINICAL IMPRESSION(S) / ED DIAGNOSES   Final diagnoses:  Right leg pain  Varicose veins of calf     Rx / DC Orders   ED Discharge Orders     None        Note:  This document was prepared using Dragon voice recognition software  and may include unintentional dictation errors.    Faythe Ghee, PA-C 08/22/23 Kristeen Mans    Corena Herter, MD 08/24/23 [phone]

## 2023-08-22 NOTE — ED Triage Notes (Signed)
Pt sts that she has been having her blood vessels in her right calf muscle is bursting. Pt right calf is bruised. Pt is able to ambulate with no assistance needed.
